# Patient Record
Sex: Female | Born: 1960 | Race: White | Hispanic: No | Marital: Single | Smoking: Never smoker
Health system: Southern US, Community
[De-identification: ages and names within clinical notes are randomized; demographics above are authoritative.]

## PROBLEM LIST (undated history)

## (undated) DIAGNOSIS — K219 Gastro-esophageal reflux disease without esophagitis: Secondary | ICD-10-CM

## (undated) DIAGNOSIS — F419 Anxiety disorder, unspecified: Secondary | ICD-10-CM

## (undated) DIAGNOSIS — K429 Umbilical hernia without obstruction or gangrene: Secondary | ICD-10-CM

## (undated) DIAGNOSIS — I10 Essential (primary) hypertension: Secondary | ICD-10-CM

## (undated) HISTORY — PX: CARDIAC CATHETERIZATION: SHX172

## (undated) HISTORY — PX: TUBAL LIGATION: SHX77

---

## 2010-01-12 ENCOUNTER — Emergency Department: Payer: Self-pay | Admitting: Emergency Medicine

## 2012-08-26 ENCOUNTER — Ambulatory Visit: Payer: Self-pay | Admitting: Family Medicine

## 2013-08-28 ENCOUNTER — Emergency Department: Payer: Self-pay | Admitting: Emergency Medicine

## 2013-08-28 LAB — BASIC METABOLIC PANEL
Anion Gap: 9 (ref 7–16)
BUN: 19 mg/dL — AB (ref 7–18)
Calcium, Total: 9.6 mg/dL (ref 8.5–10.1)
Chloride: 106 mmol/L (ref 98–107)
Co2: 27 mmol/L (ref 21–32)
Creatinine: 0.58 mg/dL — ABNORMAL LOW (ref 0.60–1.30)
EGFR (African American): >60
Glucose: 95 mg/dL (ref 65–99)
OSMOLALITY: 285 (ref 275–301)
Potassium: 3.5 mmol/L (ref 3.5–5.1)
SODIUM: 142 mmol/L (ref 136–145)

## 2013-08-28 LAB — CBC WITH DIFFERENTIAL/PLATELET
Basophil #: 0.1 10*3/uL (ref 0.0–0.1)
Basophil %: 0.6 %
EOS ABS: 0.3 10*3/uL (ref 0.0–0.7)
Eosinophil %: 2.7 %
HCT: 42 % (ref 35.0–47.0)
HGB: 13.7 g/dL (ref 12.0–16.0)
LYMPHS ABS: 2.1 10*3/uL (ref 1.0–3.6)
Lymphocyte %: 17.2 %
MCH: 27.7 pg (ref 26.0–34.0)
MCHC: 32.6 g/dL (ref 32.0–36.0)
MCV: 85 fL (ref 80–100)
Monocyte #: 0.7 x10 3/mm (ref 0.2–0.9)
Monocyte %: 6 %
Neutrophil #: 9 10*3/uL — ABNORMAL HIGH (ref 1.4–6.5)
Neutrophil %: 73.5 %
Platelet: 315 10*3/uL (ref 150–440)
RBC: 4.95 10*6/uL (ref 3.80–5.20)
RDW: 14.1 % (ref 11.5–14.5)
WBC: 12.2 10*3/uL — AB (ref 3.6–11.0)

## 2013-08-28 LAB — URINALYSIS, COMPLETE
BACTERIA: NONE SEEN
Bilirubin,UR: NEGATIVE
Glucose,UR: NEGATIVE mg/dL (ref 0–75)
Ketone: NEGATIVE
Nitrite: NEGATIVE
Ph: 5 (ref 4.5–8.0)
Protein: NEGATIVE
Specific Gravity: 1.005 (ref 1.003–1.030)
Squamous Epithelial: 2
WBC UR: 7 /HPF (ref 0–5)

## 2013-08-28 LAB — TROPONIN I: Troponin-I: <0.02 ng/mL

## 2013-11-19 ENCOUNTER — Ambulatory Visit: Payer: Self-pay | Admitting: Family Medicine

## 2013-11-19 LAB — RAPID STREP-A WITH REFLX: Micro Text Report: NEGATIVE

## 2013-11-22 LAB — BETA STREP CULTURE(ARMC)

## 2015-11-17 IMAGING — CT CT HEAD WITHOUT CONTRAST
1 series · 16 of 30 positions shown, 20 images · non-contrast
Comparison: None.

CLINICAL DATA: Headache and dizziness

EXAM:
CT HEAD WITHOUT CONTRAST
TECHNIQUE: Contiguous axial images were obtained from the base of the skull
through the vertex without intravenous contrast.

[Series 2: head wo · axial · 0.41mm/px · z∈[-21,+112]mm · 16 of 30 slices shown, 20 images]
[im 2/30  brain]
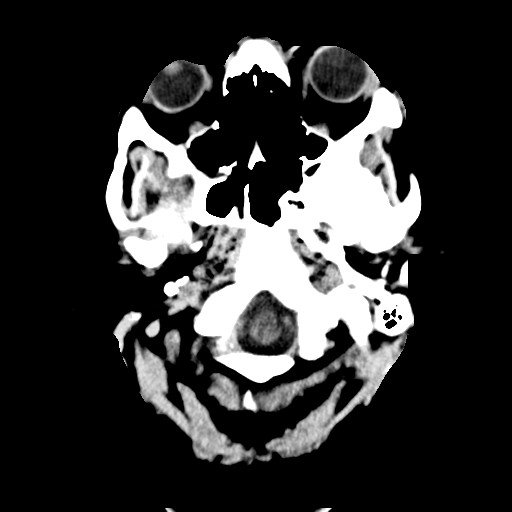
[im 2/30  bone]
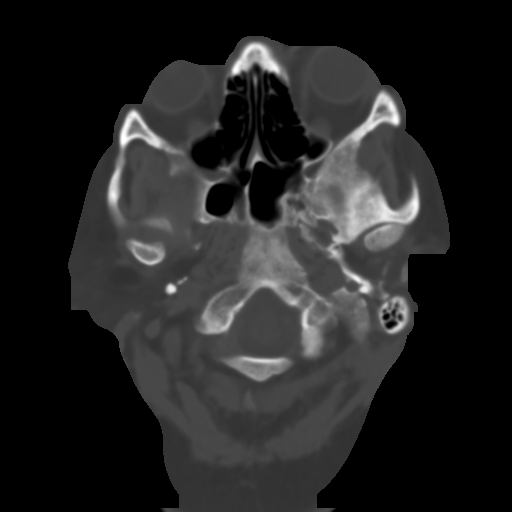
[im 4/30  brain]
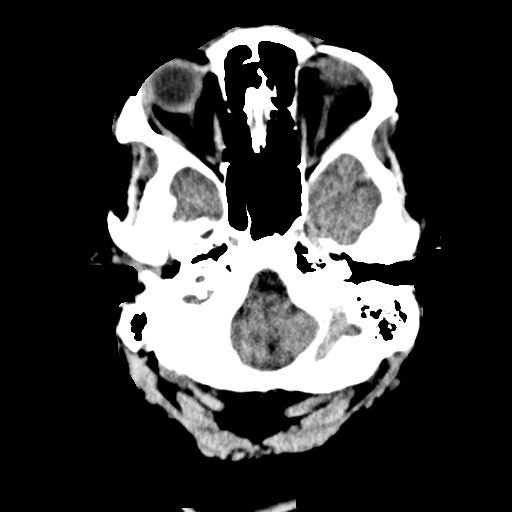
[im 6/30  brain]
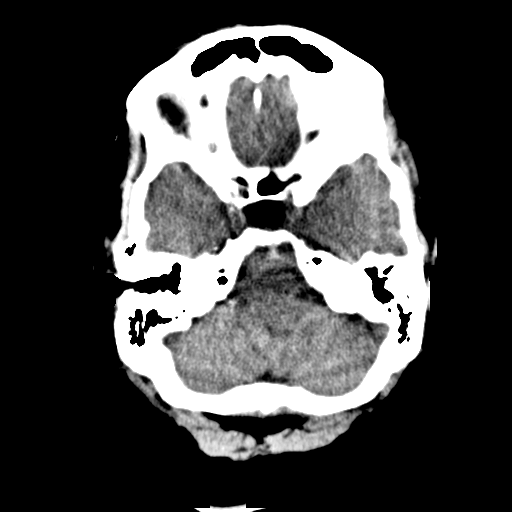
[im 8/30  brain]
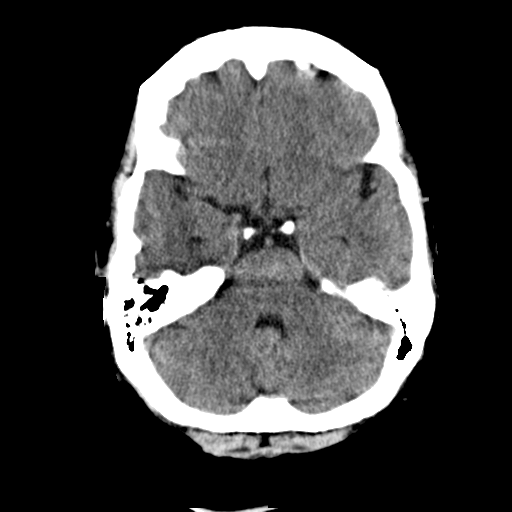
[im 9/30  brain]
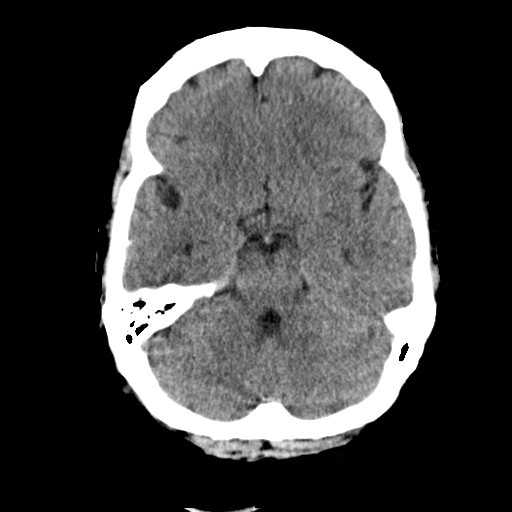
[im 9/30  bone]
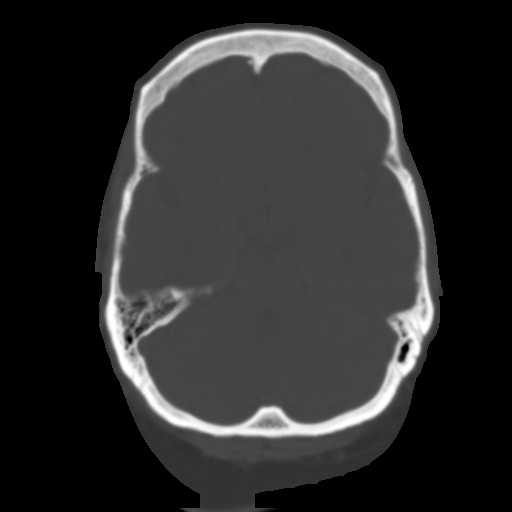
[im 11/30  brain]
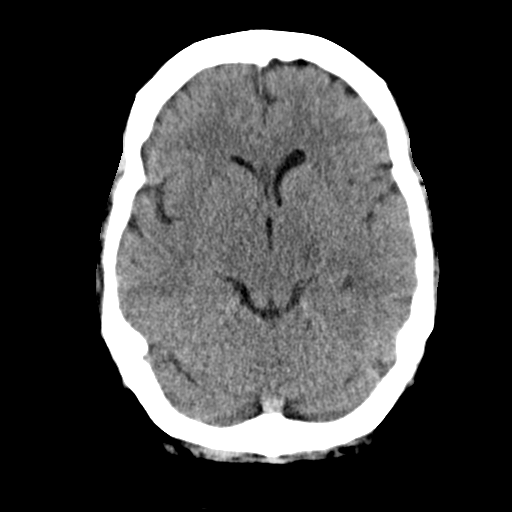
[im 13/30  brain]
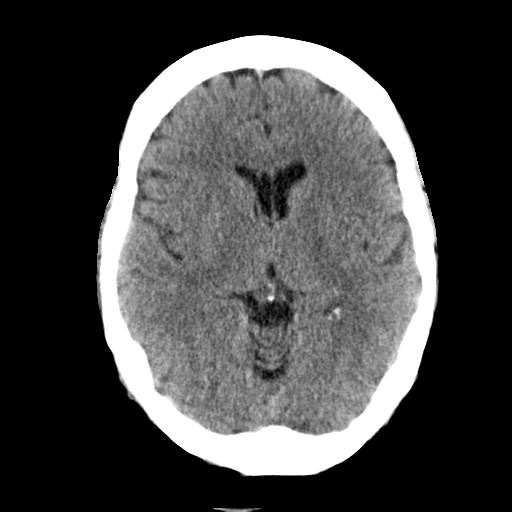
[im 15/30  brain]
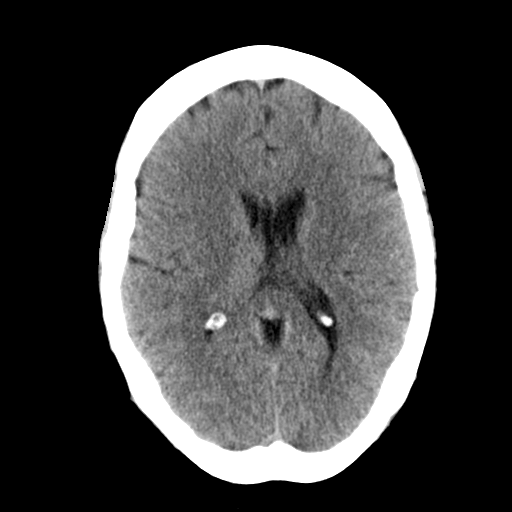
[im 16/30  brain]
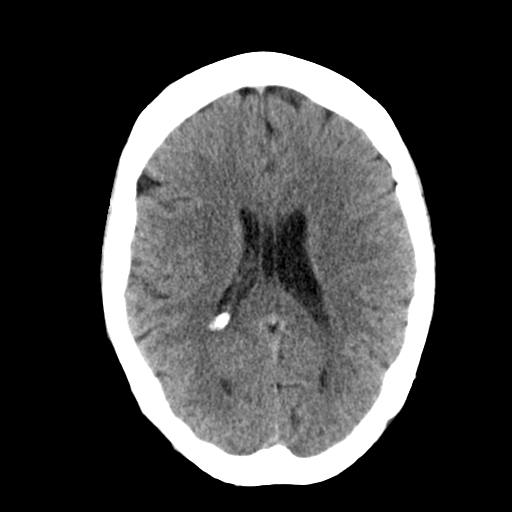
[im 16/30  bone]
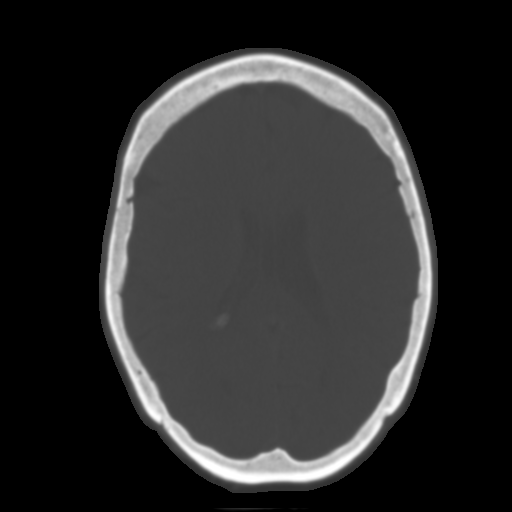
[im 18/30  brain]
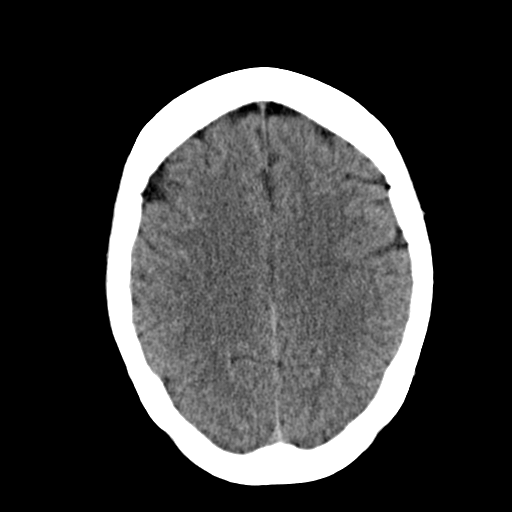
[im 20/30  brain]
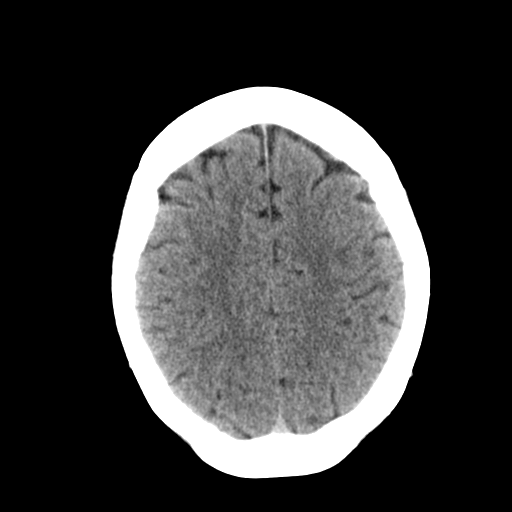
[im 22/30  brain]
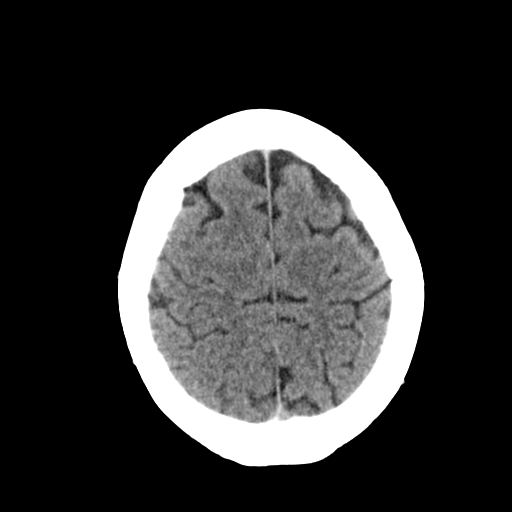
[im 23/30  brain]
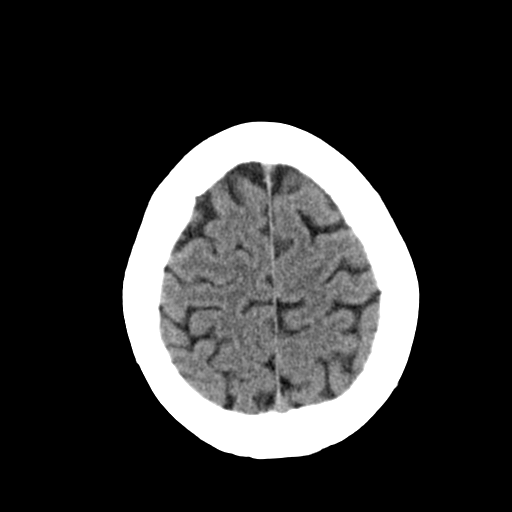
[im 23/30  bone]
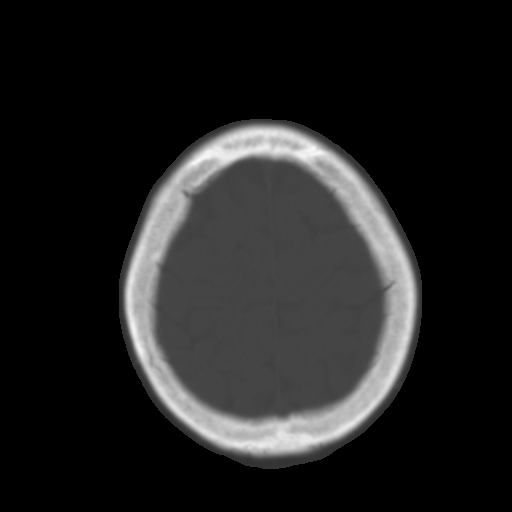
[im 25/30  brain]
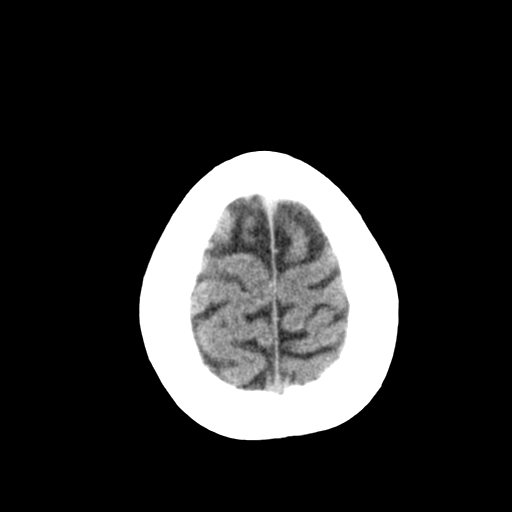
[im 27/30  brain]
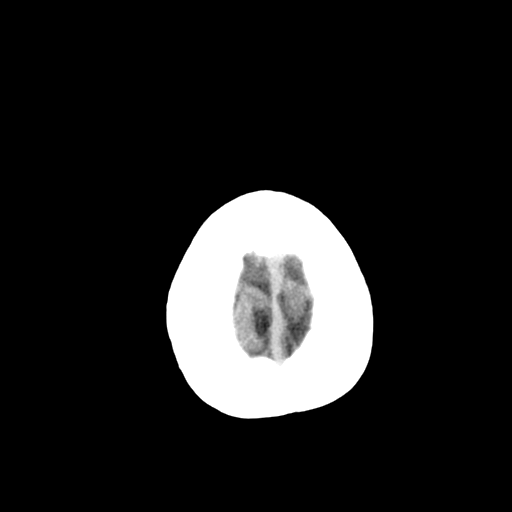
[im 29/30  brain]
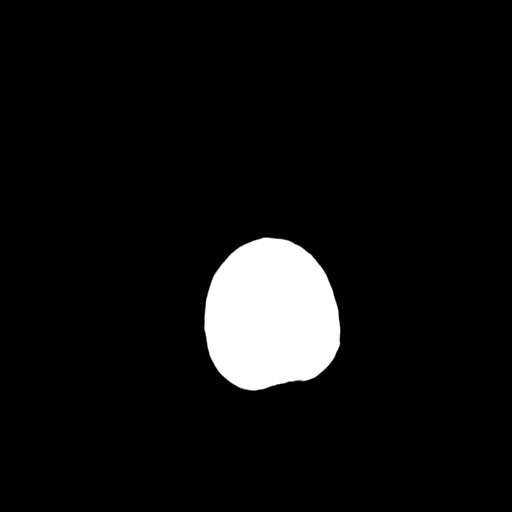

[16 of 30 positions shown; findings below may reference images not displayed]

FINDINGS: Skull and Sinuses:Negative for fracture or destructive process. The
mastoids, middle ears, and imaged paranasal sinuses are clear.

Orbits: No acute abnormality.

Brain: No evidence of acute abnormality, such as acute infarction,
hemorrhage, hydrocephalus, or mass lesion/mass effect.
IMPRESSION: Negative head CT.

## 2016-12-04 ENCOUNTER — Other Ambulatory Visit: Payer: Self-pay

## 2016-12-04 ENCOUNTER — Emergency Department
Admission: EM | Admit: 2016-12-04 | Discharge: 2016-12-04 | Disposition: A | Discharge status: Home / Self Care | Payer: BLUE CROSS/BLUE SHIELD | Attending: Emergency Medicine | Admitting: Emergency Medicine

## 2016-12-04 ENCOUNTER — Encounter: Payer: Self-pay | Admitting: Emergency Medicine

## 2016-12-04 ENCOUNTER — Emergency Department: Payer: BLUE CROSS/BLUE SHIELD

## 2016-12-04 DIAGNOSIS — R51 Headache: Secondary | ICD-10-CM | POA: Insufficient documentation

## 2016-12-04 DIAGNOSIS — R05 Cough: Secondary | ICD-10-CM | POA: Diagnosis not present

## 2016-12-04 DIAGNOSIS — I10 Essential (primary) hypertension: Secondary | ICD-10-CM | POA: Insufficient documentation

## 2016-12-04 DIAGNOSIS — J3489 Other specified disorders of nose and nasal sinuses: Secondary | ICD-10-CM | POA: Insufficient documentation

## 2016-12-04 DIAGNOSIS — Z2089 Contact with and (suspected) exposure to other communicable diseases: Secondary | ICD-10-CM | POA: Insufficient documentation

## 2016-12-04 DIAGNOSIS — R0789 Other chest pain: Secondary | ICD-10-CM | POA: Diagnosis present

## 2016-12-04 DIAGNOSIS — J40 Bronchitis, not specified as acute or chronic: Secondary | ICD-10-CM | POA: Diagnosis not present

## 2016-12-04 HISTORY — DX: Essential (primary) hypertension: I10

## 2016-12-04 LAB — BASIC METABOLIC PANEL
ANION GAP: 11 (ref 5–15)
BUN: 19 mg/dL (ref 6–20)
CALCIUM: 10.7 mg/dL — AB (ref 8.9–10.3)
CO2: 24 mmol/L (ref 22–32)
CREATININE: 0.51 mg/dL (ref 0.44–1.00)
Chloride: 101 mmol/L (ref 101–111)
GFR calc Af Amer: >60 mL/min (ref >60)
Glucose, Bld: 91 mg/dL (ref 65–99)
Potassium: 3.7 mmol/L (ref 3.5–5.1)
Sodium: 136 mmol/L (ref 135–145)

## 2016-12-04 LAB — CBC
HCT: 43.4 % (ref 35.0–47.0)
Hemoglobin: 14.5 g/dL (ref 12.0–16.0)
MCH: 28 pg (ref 26.0–34.0)
MCHC: 33.4 g/dL (ref 32.0–36.0)
MCV: 84 fL (ref 80.0–100.0)
PLATELETS: 341 10*3/uL (ref 150–440)
RBC: 5.17 MIL/uL (ref 3.80–5.20)
RDW: 14.5 % (ref 11.5–14.5)
WBC: 10.7 10*3/uL (ref 3.6–11.0)

## 2016-12-04 LAB — TROPONIN I

## 2016-12-04 MED ORDER — IPRATROPIUM-ALBUTEROL 0.5-2.5 (3) MG/3ML IN SOLN
3.0000 mL | Freq: Once | RESPIRATORY_TRACT | Status: AC
  Administered 2016-12-04: 3 mL via RESPIRATORY_TRACT
  Filled 2016-12-04: qty 3

## 2016-12-04 MED ORDER — ACETAMINOPHEN 325 MG PO TABS
650.0000 mg | ORAL_TABLET | Freq: Once | ORAL | Status: AC
  Administered 2016-12-04: 650 mg via ORAL
  Filled 2016-12-04: qty 2

## 2016-12-04 MED ORDER — ALBUTEROL SULFATE HFA 108 (90 BASE) MCG/ACT IN AERS: 2.0000 | INHALATION_SPRAY | Freq: Four times a day (QID) | RESPIRATORY_TRACT | 2 refills | Status: AC | PRN

## 2016-12-04 NOTE — ED Triage Notes (Signed)
Pt to ED c/o central and left chest pain x2 days radiating through to back.  Started as sharp pain and today is like pressure.  States nonproductive cough at home, had been diaphoretic, lightheaded, pain worse with deep breaths and movement.

## 2016-12-04 NOTE — ED Notes (Signed)
Pt states "the pain has been getting worse the last few days, but I just noticed today its getting a little harder to breathe"

## 2016-12-04 NOTE — ED Provider Notes (Signed)
[facility] Emergency Department Provider Note  ____________________________________________   I have reviewed the triage vital signs and the nursing notes.   HISTORY  Chief Complaint Chest Pain    HPI Maryama Cantu Bacchi is a 56 y.o. female with a history of hypertension, no history of ACS in fact had a negative heart cath a few years ago in [place] she states, presents today complaining of chest wall pain worse with cough.  She has had rhinorrhea and cough and slight headache for the last couple days.  Her family member was sick with same and she took care of them and now she feels sick herself.  The pain is in the chondral region, is worse when she touches or changes position, no radiation, is a sharp discomfort when she moves.  No history of DVT or PE in herself and her family no recent travel no leg swelling, pain is not pleuritic.  She has taken nothing for this pain.  She does not have a productive cough.  Past Medical History:  Diagnosis Date  . Hypertension     There are no active problems to display for this patient.   History reviewed. No pertinent surgical history.  Prior to Admission medications   Not on File    Allergies Patient has no known allergies.  History reviewed. No pertinent family history.  Social History Social History   Tobacco Use  . Smoking status: Never Smoker  . Smokeless tobacco: Never Used  Substance Use Topics  . Alcohol use: Yes    Comment: occ.  . Drug use: No    Review of Systems Constitutional: No fever/chills Eyes: No visual changes. ENT: No sore throat. No stiff neck no neck pain Cardiovascular: Positive chest wall pain Respiratory: Denies shortness of breath.  Positive cough Gastrointestinal:   no vomiting.  No diarrhea.  No constipation. Genitourinary: Negative for dysuria. Musculoskeletal: Negative lower extremity swelling Skin: Negative for rash. Neurological: Negative for severe headaches,  focal weakness or numbness.   ____________________________________________   PHYSICAL EXAM:  VITAL SIGNS: ED Triage Vitals  Enc Vitals Group     BP 12/04/16 1614 (!) 154/88     Pulse Rate 12/04/16 1614 66     Resp 12/04/16 1614 16     Temp 12/04/16 1614 98.3 F (36.8 C)     Temp Source 12/04/16 1614 Oral     SpO2 12/04/16 1614 100 %     Weight 12/04/16 1615 237 lb (107.5 kg)     Height 12/04/16 1615 5\' 2"  (1.575 m)     Head Circumference --      Peak Flow --      Pain Score 12/04/16 1614 0     Pain Loc --      Pain Edu? --      Excl. in [place]? --     Constitutional: Alert and oriented. Well appearing and in no acute distress. Eyes: Conjunctivae are normal Head: Atraumatic HEENT: No congestion/rhinnorhea. Mucous membranes are moist.  Oropharynx non-erythematous Neck:   Nontender with no meningismus, no masses, no stridor Cardiovascular: Normal rate, regular rhythm. Grossly normal heart sounds.  Good peripheral circulation. Chest: Tender to palpation in the chondral regions bilaterally, when I touch these areas patient say "ouch that the pain right there" and pulls back.  There is no crepitus there is no flail chest there is no lesions noted.  Female nurse chaperone present. Respiratory: Normal respiratory effort.  No retractions. Lungs slight wheezing when I asked her  to take a deep breath she coughs which causes her chest to hurt.   Abdominal: Soft and nontender. No distention. No guarding no rebound Back:  There is no focal tenderness or step off.  there is no midline tenderness there are no lesions noted. there is no CVA tenderness Musculoskeletal: No lower extremity tenderness, no upper extremity tenderness. No joint effusions, no DVT signs strong distal pulses no edema Neurologic:  Normal speech and language. No gross focal neurologic deficits are appreciated.  Skin:  Skin is warm, dry and intact. No rash noted. Psychiatric: Mood and affect are normal. Speech and behavior  are normal.  ____________________________________________   LABS (all labs ordered are listed, but only abnormal results are displayed)  Labs Reviewed  BASIC METABOLIC PANEL - Abnormal; Notable for the following components:      Result Value   Calcium 10.7 (*)    All other components within normal limits  CBC  TROPONIN I    Pertinent labs  results that were available during my care of the patient were reviewed by me and considered in my medical decision making (see chart for details). ____________________________________________  EKG  I personally interpreted any EKGs ordered by me or triage Sinus rhythm rate 69 bpm no acute ST elevation or depression nonspecific ST changes, borderline LAD change from 3 years ago ____________________________________________  RADIOLOGY  Pertinent labs & imaging results that were available during my care of the patient were reviewed by me and considered in my medical decision making (see chart for details). If possible, patient and/or family made aware of any abnormal findings. ____________________________________________    PROCEDURES  Procedure(s) performed: None  Procedures  Critical Care performed: None  ____________________________________________   INITIAL IMPRESSION / ASSESSMENT AND PLAN / ED COURSE  Pertinent labs & imaging results that were available during my care of the patient were reviewed by me and considered in my medical decision making (see chart for details).  Patient with constant reproducible chest wall pain in the context of a nonproductive cough for 3 days despite this, cardiac enzymes are negative and EKG shows no change from prior.  Very low suspicion for ACS.  At this time, there does not appear to be clinical evidence to support the diagnosis of pulmonary embolus, dissection, myocarditis, endocarditis, pericarditis, pericardial tamponade, acute coronary syndrome, pneumothorax, pneumonia, or any other acute  intrathoracic pathology that will require admission or acute intervention. Nor is there evidence of any significant intra-abdominal pathology causing this discomfort.  Patient is feeling much better after albuterol treatment, will send her home with albuterol for what is likely bronchitis.  Very reproducible chest wall pain with negative cardiac enzymes despite 3 days of constant pain with no pleuritic nature and no significant risk factors for DVT or PE.    ____________________________________________   FINAL CLINICAL IMPRESSION(S) / ED DIAGNOSES  Final diagnoses:  None      This chart was dictated using voice recognition software.  Despite best efforts to proofread,  errors can occur which can change meaning.      Schuyler Amor, MD 12/04/16 [phone]

## 2016-12-04 NOTE — ED Notes (Signed)
Triage started being charted in error under [place], EDT.  Triage was completed by Annie Main, RN.

## 2017-02-10 ENCOUNTER — Other Ambulatory Visit: Payer: Self-pay | Admitting: Family Medicine

## 2017-02-10 DIAGNOSIS — Z1231 Encounter for screening mammogram for malignant neoplasm of breast: Secondary | ICD-10-CM

## 2017-03-06 ENCOUNTER — Ambulatory Visit
Admission: RE | Admit: 2017-03-06 | Discharge: 2017-03-06 | Disposition: A | Discharge status: Home / Self Care | Payer: BLUE CROSS/BLUE SHIELD | Source: Ambulatory Visit | Attending: Family Medicine | Admitting: Family Medicine

## 2017-03-06 DIAGNOSIS — Z1231 Encounter for screening mammogram for malignant neoplasm of breast: Secondary | ICD-10-CM | POA: Diagnosis not present

## 2017-03-11 ENCOUNTER — Encounter: Admission: RE | Payer: Self-pay | Source: Ambulatory Visit

## 2017-03-11 ENCOUNTER — Ambulatory Visit
Admission: RE | Admit: 2017-03-11 | Payer: BLUE CROSS/BLUE SHIELD | Source: Ambulatory Visit | Admitting: Unknown Physician Specialty

## 2017-03-11 SURGERY — COLONOSCOPY WITH PROPOFOL
Anesthesia: General

## 2017-04-09 ENCOUNTER — Emergency Department
Admission: EM | Admit: 2017-04-09 | Discharge: 2017-04-09 | Disposition: A | Discharge status: Home / Self Care | Payer: BLUE CROSS/BLUE SHIELD | Attending: Student in an Organized Health Care Education/Training Program | Admitting: Student in an Organized Health Care Education/Training Program

## 2017-04-09 ENCOUNTER — Encounter: Payer: Self-pay | Admitting: *Deleted

## 2017-04-09 DIAGNOSIS — R040 Epistaxis: Secondary | ICD-10-CM | POA: Diagnosis present

## 2017-04-09 DIAGNOSIS — I10 Essential (primary) hypertension: Secondary | ICD-10-CM | POA: Insufficient documentation

## 2017-04-09 MED ORDER — OXYMETAZOLINE HCL 0.05 % NA SOLN
1.0000 | Freq: Once | NASAL | Status: AC
  Administered 2017-04-09: 1 via NASAL

## 2017-04-09 MED ORDER — OXYMETAZOLINE HCL 0.05 % NA SOLN
NASAL | Status: AC
  Administered 2017-04-09: 22:00:00
  Filled 2017-04-09: qty 15

## 2017-04-09 NOTE — ED Notes (Signed)
Pt blowing her nose in triage, educated about not doing so at this time

## 2017-04-09 NOTE — ED Notes (Signed)
Pt states she has had a cold for about 3 days. She started to develop a HA today, she took some aspirin and she noticed she started having a gushing nose bleed around 5:30 and she got it to stop and it started back a few hours later out of both nostrils this time. Nose continued to stop and and start gushing blood since then. Nose is not actively bleeding now. Pt is alert and oriented x 4.

## 2017-04-09 NOTE — ED Triage Notes (Signed)
Pt reports onset of nosebleeds intermittent since this afternoon. Pt says that she has had cough, cold and congestion x 3 days. No current blood thinners. Bleeding has subsided at time of triage.

## 2017-04-09 NOTE — Discharge Instructions (Signed)
Use the nasal spray and nose clip to manage active bleeding, as demonstrated. Avoid blowing your nose for the next few days. Use dampened cotton swabs to cleanse or 'scratch' the nose. See your provider or return as needed.

## 2017-04-11 NOTE — ED Provider Notes (Signed)
[facility] Emergency Department Provider Note ____________________________________________  Time seen: 2255  I have reviewed the triage vital signs and the nursing notes.  HISTORY  Chief Complaint  Epistaxis  HPI Amy Barker is a 57 y.o. female presents to the ED for evaluation of intermittent nosebleeds since early this afternoon.  Patient admits that she has had several days of cough, congestion, nasal drainage, and sneezing.  She describes bleeding that started out of her right nostril earlier today.  She presented to the ED after she felt had a difficult time getting the nosebleed to stop.  She denies any nausea, vomiting, or dizziness.  She takes no daily blood thinners and has not been using any nasal sprays.  Denies any current pain or active bleeding at this time.  Patient was given a nasal clip in triage.  Past Medical History:  Diagnosis Date  . Hypertension     There are no active problems to display for this patient.   History reviewed. No pertinent surgical history.  Prior to Admission medications   Medication Sig Start Date End Date Taking? Authorizing Provider  albuterol (PROVENTIL HFA;VENTOLIN HFA) 108 (90 Base) MCG/ACT inhaler Inhale 2 puffs every 6 (six) hours as needed into the lungs for wheezing or shortness of breath. 12/04/16   Schuyler Amor, MD   Allergies Patient has no known allergies.  Family History  Problem Relation Age of Onset  . Breast cancer Sister   . Breast cancer Cousin     Social History Social History   Tobacco Use  . Smoking status: Never Smoker  . Smokeless tobacco: Never Used  Substance Use Topics  . Alcohol use: Yes    Comment: occ.  . Drug use: No    Review of Systems  Constitutional: Negative for fever. Eyes: Negative for visual changes. ENT: Negative for sore throat.  Nosebleeds as above. Cardiovascular: Negative for chest pain. Respiratory: Negative for shortness of  breath. Gastrointestinal: Negative for abdominal pain, vomiting and diarrhea. Neurological: Negative for headaches, focal weakness or numbness. ____________________________________________  PHYSICAL EXAM:  VITAL SIGNS: ED Triage Vitals  Enc Vitals Group     BP 04/09/17 2126 (!) 155/76     Pulse Rate 04/09/17 2126 93     Resp 04/09/17 2126 16     Temp 04/09/17 2126 98.5 F (36.9 C)     Temp Source 04/09/17 2126 Oral     SpO2 04/09/17 2126 96 %     Weight 04/09/17 2126 240 lb (108.9 kg)     Height 04/09/17 2126 5\' 2"  (1.575 m)     Head Circumference --      Peak Flow --      Pain Score 04/09/17 2125 7     Pain Loc --      Pain Edu? --      Excl. in [place]? --     Constitutional: Alert and oriented. Well appearing and in no distress. Head: Normocephalic and atraumatic. Eyes: Conjunctivae are normal. PERRL. Normal extraocular movements Ears: Canals clear. TMs intact bilaterally. Nose: No congestion/rhinorrhea. No current epistaxis.  Right nostril with some fresh blood noted without any active bleeding. Mouth/Throat: Mucous membranes are moist. Neck: Supple. No thyromegaly. Cardiovascular: Normal rate, regular rhythm. Normal distal pulses. Respiratory: Normal respiratory effort. No wheezes/rales/rhonchi. Musculoskeletal: Nontender with normal range of motion in all extremities.  Neurologic:  Normal gait without ataxia. Normal speech and language. No gross focal neurologic deficits are appreciated. Skin:  Skin is warm, dry and  intact. No rash noted. Psychiatric: Mood and affect are normal. Patient exhibits appropriate insight and judgment. ____________________________________________  PROCEDURES  Procedures Oxymetazoline 0.05% ii gtts per nostril ____________________________________________  INITIAL IMPRESSION / ASSESSMENT AND PLAN / ED COURSE  Patient with ED evaluation of spontaneous nosebleed on the right.  Patient denies any preceding trauma.  She does admit to several  days of sinus symptoms including nasal drainage and sinus congestion.  She presents to the ED for evaluation management of a nosebleed that has now resolved at the time of her evaluation.  Patient is reassured by her exam overall.  Afrin is placed in the nose to further clear any blood and shows no active bleeding.  Patient is discharged with instructions on management of acute nosebleeds.  She will follow with her primary provider return to the ED as needed. He is discharged with stable vital signs. ____________________________________________  FINAL CLINICAL IMPRESSION(S) / ED DIAGNOSES  Final diagnoses:  Epistaxis      Carmie End, Dannielle Karvonen, PA-C 04/11/17 1915    Merlyn Lot, MD 04/12/17 1930

## 2017-05-01 ENCOUNTER — Encounter: Admission: RE | Payer: Self-pay | Source: Ambulatory Visit

## 2017-05-01 ENCOUNTER — Ambulatory Visit
Admission: RE | Admit: 2017-05-01 | Payer: BLUE CROSS/BLUE SHIELD | Source: Ambulatory Visit | Admitting: Unknown Physician Specialty

## 2017-05-01 SURGERY — COLONOSCOPY WITH PROPOFOL
Anesthesia: General

## 2017-05-07 ENCOUNTER — Encounter: Payer: Self-pay | Admitting: *Deleted

## 2017-05-08 ENCOUNTER — Ambulatory Visit: Payer: BLUE CROSS/BLUE SHIELD | Admitting: Anesthesiology

## 2017-05-08 ENCOUNTER — Encounter: Payer: Self-pay | Admitting: *Deleted

## 2017-05-08 ENCOUNTER — Ambulatory Visit
Admission: RE | Admit: 2017-05-08 | Discharge: 2017-05-08 | Disposition: A | Discharge status: Home / Self Care | Payer: BLUE CROSS/BLUE SHIELD | Source: Ambulatory Visit | Attending: Unknown Physician Specialty | Admitting: Unknown Physician Specialty

## 2017-05-08 ENCOUNTER — Encounter
Admission: RE | Disposition: A | Discharge status: Home / Self Care | Payer: Self-pay | Source: Ambulatory Visit | Attending: Unknown Physician Specialty

## 2017-05-08 DIAGNOSIS — D123 Benign neoplasm of transverse colon: Secondary | ICD-10-CM | POA: Insufficient documentation

## 2017-05-08 DIAGNOSIS — Z800 Family history of malignant neoplasm of digestive organs: Secondary | ICD-10-CM | POA: Diagnosis present

## 2017-05-08 DIAGNOSIS — Z6841 Body Mass Index (BMI) 40.0 and over, adult: Secondary | ICD-10-CM | POA: Diagnosis not present

## 2017-05-08 DIAGNOSIS — I10 Essential (primary) hypertension: Secondary | ICD-10-CM | POA: Insufficient documentation

## 2017-05-08 DIAGNOSIS — Z7982 Long term (current) use of aspirin: Secondary | ICD-10-CM | POA: Diagnosis not present

## 2017-05-08 DIAGNOSIS — K640 First degree hemorrhoids: Secondary | ICD-10-CM | POA: Diagnosis not present

## 2017-05-08 DIAGNOSIS — Z1211 Encounter for screening for malignant neoplasm of colon: Secondary | ICD-10-CM | POA: Diagnosis not present

## 2017-05-08 DIAGNOSIS — Z79899 Other long term (current) drug therapy: Secondary | ICD-10-CM | POA: Insufficient documentation

## 2017-05-08 HISTORY — DX: Gastro-esophageal reflux disease without esophagitis: K21.9

## 2017-05-08 HISTORY — PX: COLONOSCOPY WITH PROPOFOL: SHX5780

## 2017-05-08 HISTORY — DX: Morbid (severe) obesity due to excess calories: E66.01

## 2017-05-08 HISTORY — DX: Umbilical hernia without obstruction or gangrene: K42.9

## 2017-05-08 HISTORY — DX: Anxiety disorder, unspecified: F41.9

## 2017-05-08 SURGERY — COLONOSCOPY WITH PROPOFOL
Anesthesia: General

## 2017-05-08 MED ORDER — LIDOCAINE HCL (PF) 2 % IJ SOLN
INTRAMUSCULAR | Status: DC | PRN
  Administered 2017-05-08: 100 mg

## 2017-05-08 MED ORDER — PROPOFOL 10 MG/ML IV BOLUS
INTRAVENOUS | Status: DC | PRN
  Administered 2017-05-08 (×2): 20 mg via INTRAVENOUS
  Administered 2017-05-08: 10 mg via INTRAVENOUS

## 2017-05-08 MED ORDER — SODIUM CHLORIDE 0.9 % IV SOLN: INTRAVENOUS | Status: DC

## 2017-05-08 MED ORDER — PROPOFOL 500 MG/50ML IV EMUL
INTRAVENOUS | Status: DC | PRN
  Administered 2017-05-08: 70 ug/kg/min via INTRAVENOUS

## 2017-05-08 MED ORDER — MIDAZOLAM HCL 2 MG/2ML IJ SOLN
INTRAMUSCULAR | Status: AC
  Filled 2017-05-08: qty 2

## 2017-05-08 MED ORDER — LIDOCAINE HCL (PF) 2 % IJ SOLN
INTRAMUSCULAR | Status: AC
  Filled 2017-05-08: qty 10

## 2017-05-08 MED ORDER — FENTANYL CITRATE (PF) 100 MCG/2ML IJ SOLN
INTRAMUSCULAR | Status: AC
  Filled 2017-05-08: qty 2

## 2017-05-08 MED ORDER — SODIUM CHLORIDE 0.9 % IV SOLN
INTRAVENOUS | Status: DC
  Administered 2017-05-08: 1000 mL via INTRAVENOUS

## 2017-05-08 MED ORDER — EPHEDRINE SULFATE 50 MG/ML IJ SOLN
INTRAMUSCULAR | Status: DC | PRN
  Administered 2017-05-08: 15 mg via INTRAVENOUS
  Administered 2017-05-08: 10 mg via INTRAVENOUS

## 2017-05-08 MED ORDER — FENTANYL CITRATE (PF) 100 MCG/2ML IJ SOLN
INTRAMUSCULAR | Status: DC | PRN
  Administered 2017-05-08 (×4): 25 ug via INTRAVENOUS

## 2017-05-08 MED ORDER — MIDAZOLAM HCL 5 MG/5ML IJ SOLN
INTRAMUSCULAR | Status: DC | PRN
  Administered 2017-05-08: 2 mg via INTRAVENOUS

## 2017-05-08 NOTE — Op Note (Signed)
[facility] Gastroenterology Patient Name: Amy Barker Procedure Date: 05/08/2017 9:41 AM MRN: [id number] Account #: [id number] Date of Birth: 1961-01-25 Admit Type: Outpatient Age: 57 Room: [facility] ENDO ROOM 1 Gender: Female Note Status: Finalized Procedure:            Colonoscopy Indications:          Screening in patient at increased risk: Family history                        of 1st-degree relative with colorectal cancer Providers:            Manya Silvas, MD Referring MD:         Wynona Canes. Kym Groom, MD (Referring MD) Medicines:            Propofol per Anesthesia Complications:        No immediate complications. Procedure:            Pre-Anesthesia Assessment:                       - After reviewing the risks and benefits, the patient                        was deemed in satisfactory condition to undergo the                        procedure.                       After obtaining informed consent, the colonoscope was                        passed under direct vision. Throughout the procedure,                        the patient's blood pressure, pulse, and oxygen                        saturations were monitored continuously. The                        Colonoscope was introduced through the anus and                        advanced to the the cecum, identified by appendiceal                        orifice and ileocecal valve. The colonoscopy was                        performed without difficulty. The patient tolerated the                        procedure well. The quality of the bowel preparation                        was good. Findings:      A diminutive polyp was found in the splenic flexure. The polyp was       sessile. The polyp was removed with a jumbo cold forceps. Resection and       retrieval were complete.  Internal hemorrhoids were found during endoscopy. The hemorrhoids were       small and Grade I (internal hemorrhoids that do not  prolapse).      The exam was otherwise without abnormality. Impression:           - One diminutive polyp at the splenic flexure, removed                        with a jumbo cold forceps. Resected and retrieved.                       - Internal hemorrhoids.                       - The examination was otherwise normal. Recommendation:       - Await pathology results. Probably repeat in 5 years. Manya Silvas, MD 05/08/2017 10:07:02 AM This report has been signed electronically. Number of Addenda: 0 Note Initiated On: 05/08/2017 9:41 AM Scope Withdrawal Time: 0 hours 9 minutes 43 seconds  Total Procedure Duration: 0 hours 16 minutes 49 seconds       [facility]

## 2017-05-08 NOTE — Anesthesia Postprocedure Evaluation (Signed)
Anesthesia Post Note  Patient: Amy Barker  Procedure(s) Performed: COLONOSCOPY WITH PROPOFOL (N/A )  Patient location during evaluation: Endoscopy Anesthesia Type: General Level of consciousness: awake and alert Pain management: pain level controlled Vital Signs Assessment: post-procedure vital signs reviewed and stable Respiratory status: spontaneous breathing, nonlabored ventilation, respiratory function stable and patient connected to nasal cannula oxygen Cardiovascular status: blood pressure returned to baseline and stable Postop Assessment: no apparent nausea or vomiting Anesthetic complications: no     Last Vitals:  Vitals:   05/08/17 1017 05/08/17 1027  BP: (!) 98/59 (!) 93/46  Pulse: 79 70  Resp: 17 12  Temp:    SpO2: 98% 98%    Last Pain:  Vitals:   05/08/17 1027  TempSrc:   PainSc: 0-No pain                 Precious Haws Myrel Rappleye

## 2017-05-08 NOTE — Transfer of Care (Signed)
Immediate Anesthesia Transfer of Care Note  Patient: Amy Barker  Procedure(s) Performed: COLONOSCOPY WITH PROPOFOL (N/A )  Patient Location: PACU  Anesthesia Type:General  Level of Consciousness: sedated  Airway & Oxygen Therapy: Patient Spontanous Breathing and Patient connected to nasal cannula oxygen  Post-op Assessment: Report given to RN and Post -op Vital signs reviewed and stable  Post vital signs: Reviewed and stable  Last Vitals:  Vitals Value Taken Time  BP    Temp    Pulse    Resp    SpO2      Last Pain:  Vitals:   05/08/17 0923  TempSrc: Tympanic  PainSc: 0-No pain         Complications: No apparent anesthesia complications

## 2017-05-08 NOTE — Anesthesia Post-op Follow-up Note (Signed)
Anesthesia QCDR form completed.        

## 2017-05-08 NOTE — Anesthesia Preprocedure Evaluation (Signed)
Anesthesia Evaluation  Patient identified by MRN, date of birth, ID band Patient awake    Reviewed: Allergy & Precautions, H&P , NPO status , Patient's Chart, lab work & pertinent test results  History of Anesthesia Complications Negative for: history of anesthetic complications  Airway Mallampati: III  TM Distance: <3 FB Neck ROM: full    Dental  (+) Chipped   Pulmonary neg pulmonary ROS,           Cardiovascular Exercise Tolerance: Good hypertension,      Neuro/Psych PSYCHIATRIC DISORDERS Anxiety negative neurological ROS     GI/Hepatic Neg liver ROS, GERD  Medicated and Controlled,  Endo/Other  negative endocrine ROS  Renal/GU negative Renal ROS  negative genitourinary   Musculoskeletal   Abdominal   Peds  Hematology negative hematology ROS (+)   Anesthesia Other Findings Signs and symptoms suggestive of sleep apnea   Past Medical History: No date: Anxiety No date: GERD (gastroesophageal reflux disease) No date: Hypertension No date: Morbid obesity ([place]) No date: Umbilical hernia  Past Surgical History: No date: TUBAL LIGATION  BMI    Body Mass Index:  43.90 kg/m      Reproductive/Obstetrics negative OB ROS                             Anesthesia Physical Anesthesia Plan  ASA: III  Anesthesia Plan: General   Post-op Pain Management:    Induction: Intravenous  PONV Risk Score and Plan: Propofol infusion and TIVA  Airway Management Planned: Natural Airway and Nasal Cannula  Additional Equipment:   Intra-op Plan:   Post-operative Plan:   Informed Consent: I have reviewed the patients History and Physical, chart, labs and discussed the procedure including the risks, benefits and alternatives for the proposed anesthesia with the patient or authorized representative who has indicated his/her understanding and acceptance.   Dental Advisory Given  Plan Discussed  with: Anesthesiologist, CRNA and Surgeon  Anesthesia Plan Comments: (Patient consented for risks of anesthesia including but not limited to:  - adverse reactions to medications - risk of intubation if required - damage to teeth, lips or other oral mucosa - sore throat or hoarseness - Damage to heart, brain, lungs or loss of life  Patient voiced understanding.)        Anesthesia Quick Evaluation

## 2017-05-08 NOTE — H&P (Signed)
Primary Care Physician:  Valera Castle, MD Primary Gastroenterologist:  Dr. Vira Agar  Pre-Procedure History & Physical: HPI:  Amy Barker is a 57 y.o. female is here for an colonoscopy.  Done due to sister with colon cancer and another sister with colon polyps.   Past Medical History:  Diagnosis Date  . Anxiety   . GERD (gastroesophageal reflux disease)   . Hypertension   . Morbid obesity ([place])   . Umbilical hernia     Past Surgical History:  Procedure Laterality Date  . TUBAL LIGATION      Prior to Admission medications   Medication Sig Start Date End Date Taking? Authorizing Provider  aspirin EC 81 MG tablet Take 81 mg by mouth daily.   Yes [provider]  losartan (COZAAR) 50 MG tablet Take 50 mg by mouth daily.   Yes [provider]  albuterol (PROVENTIL HFA;VENTOLIN HFA) 108 (90 Base) MCG/ACT inhaler Inhale 2 puffs every 6 (six) hours as needed into the lungs for wheezing or shortness of breath. 12/04/16   Schuyler Amor, MD    Allergies as of 05/07/2017  . (No Known Allergies)    Family History  Problem Relation Age of Onset  . Breast cancer Sister   . Breast cancer Cousin     Social History   Socioeconomic History  . Marital status: Single    Spouse name: Not on file  . Number of children: Not on file  . Years of education: Not on file  . Highest education level: Not on file  Occupational History  . Not on file  Social Needs  . Financial resource strain: Not on file  . Food insecurity:    Worry: Not on file    Inability: Not on file  . Transportation needs:    Medical: Not on file    Non-medical: Not on file  Tobacco Use  . Smoking status: Never Smoker  . Smokeless tobacco: Never Used  Substance and Sexual Activity  . Alcohol use: Yes    Comment: occ.  . Drug use: No  . Sexual activity: Not on file  Lifestyle  . Physical activity:    Days per week: Not on file    Minutes per session: Not on file  .  Stress: Not on file  Relationships  . Social connections:    Talks on phone: Not on file    Gets together: Not on file    Attends religious service: Not on file    Active member of club or organization: Not on file    Attends meetings of clubs or organizations: Not on file    Relationship status: Not on file  . Intimate partner violence:    Fear of current or ex partner: Not on file    Emotionally abused: Not on file    Physically abused: Not on file    Forced sexual activity: Not on file  Other Topics Concern  . Not on file  Social History Narrative  . Not on file    Review of Systems: See HPI, otherwise negative ROS  Physical Exam: BP (!) 131/95   Pulse 86   Temp (!) 97.1 F (36.2 C) (Tympanic)   Resp 16   Ht 5\' 2"  (1.575 m)   Wt 108.9 kg (240 lb)   SpO2 98%   BMI 43.90 kg/m  General:   Alert,  pleasant and cooperative in NAD Head:  Normocephalic and atraumatic. Neck:  Supple; no masses or thyromegaly.  Lungs:  Clear throughout to auscultation.    Heart:  Regular rate and rhythm. Abdomen:  Soft, nontender and nondistended. Normal bowel sounds, without guarding, and without rebound.   Neurologic:  Alert and  oriented x4;  grossly normal neurologically.  Impression/Plan: Amy Barker is here for an colonoscopy to be performed for colon cancer screening with sister with colon cancer  Risks, benefits, limitations, and alternatives regarding  colonoscopy have been reviewed with the patient.  Questions have been answered.  All parties agreeable.   Gaylyn Cheers, MD  05/08/2017, 9:39 AM

## 2017-05-11 LAB — SURGICAL PATHOLOGY

## 2018-03-09 ENCOUNTER — Other Ambulatory Visit: Payer: Self-pay | Admitting: Family Medicine

## 2018-03-09 DIAGNOSIS — Z1231 Encounter for screening mammogram for malignant neoplasm of breast: Secondary | ICD-10-CM

## 2019-02-23 IMAGING — CR DG CHEST 2V
2 series · 2 of 2 positions shown · non-contrast
Comparison: 01/12/2010

CLINICAL DATA: Left-sided chest pressure with radiation to left
shoulder 2 days. Headache.

EXAM:
CHEST  2 VIEW

[chest pa]
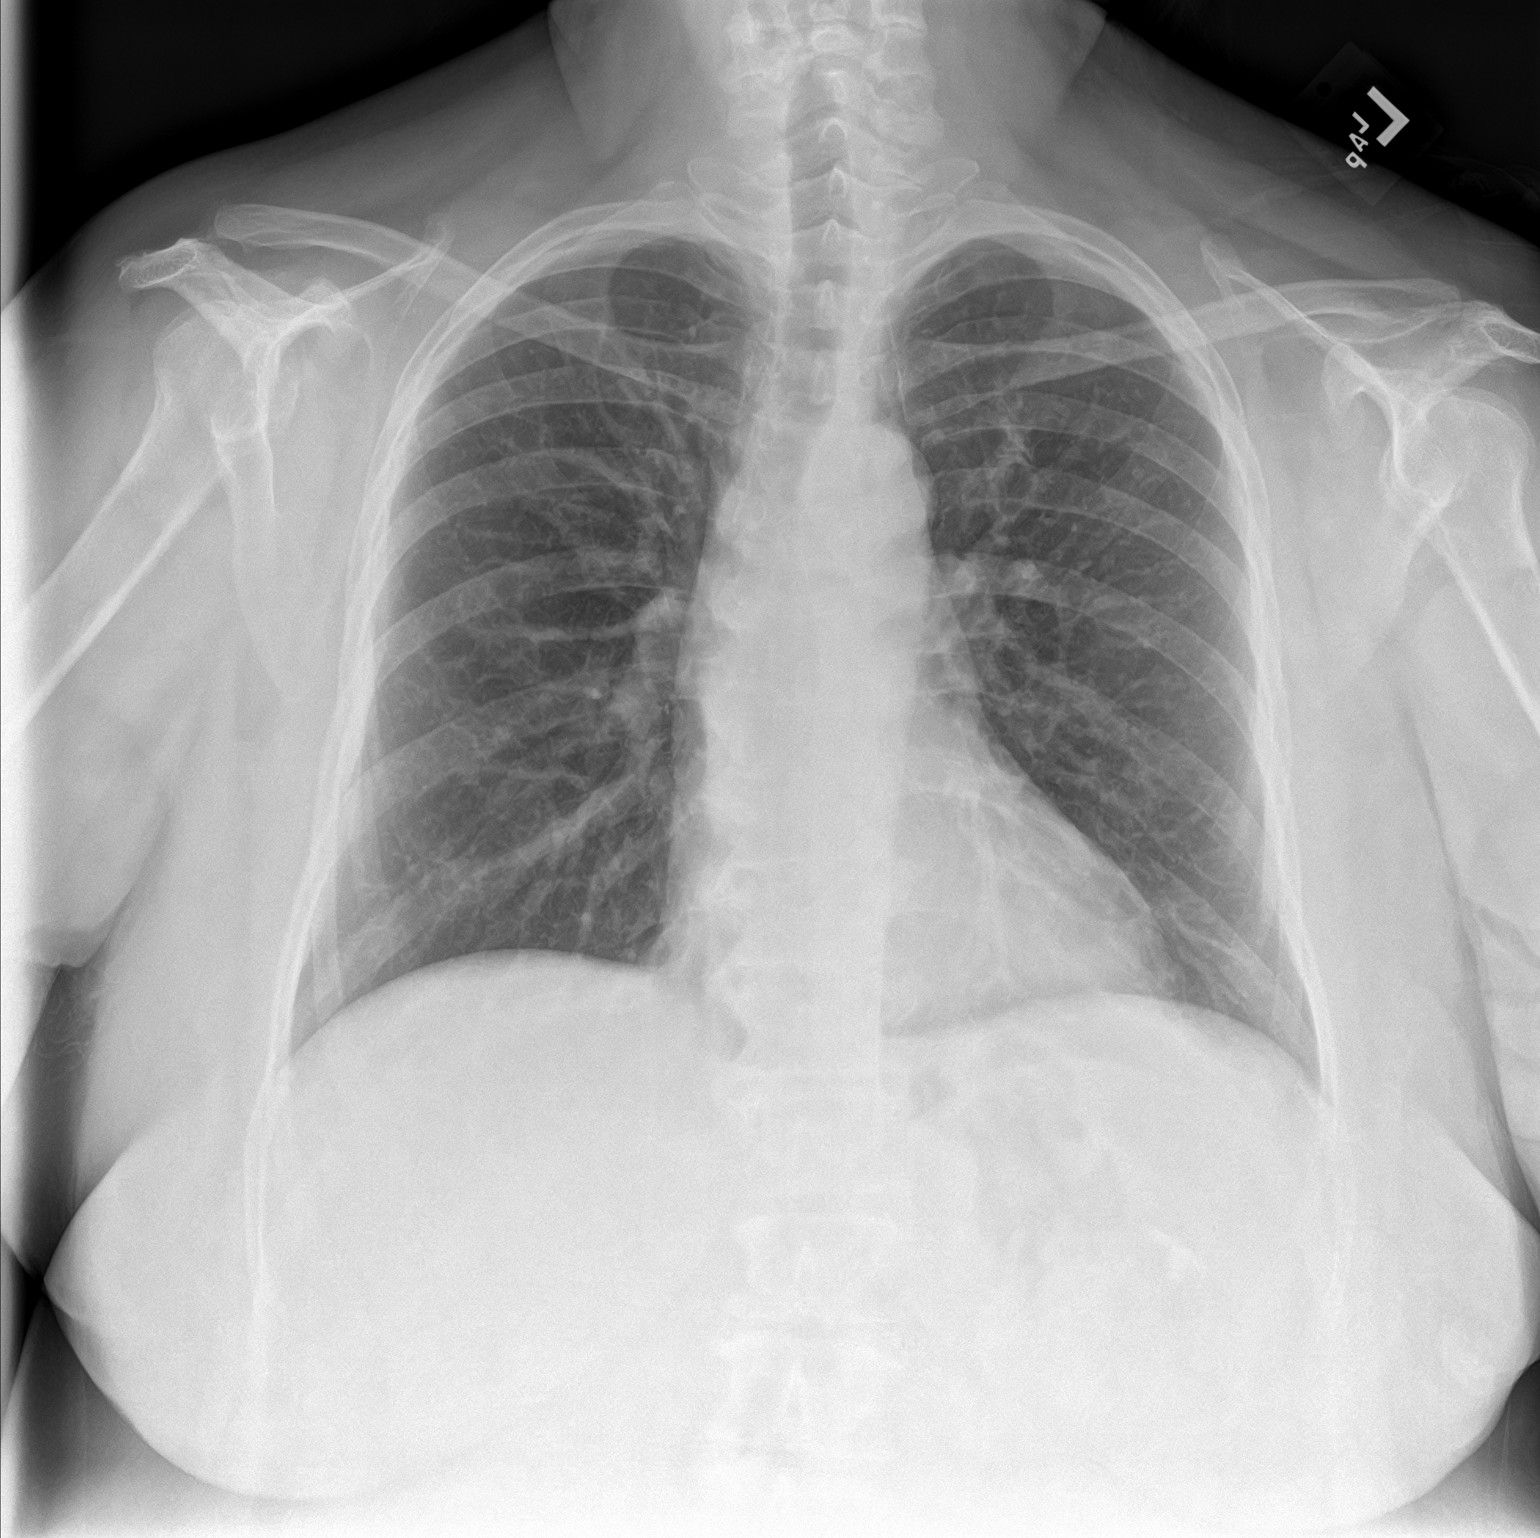

[chest lat]
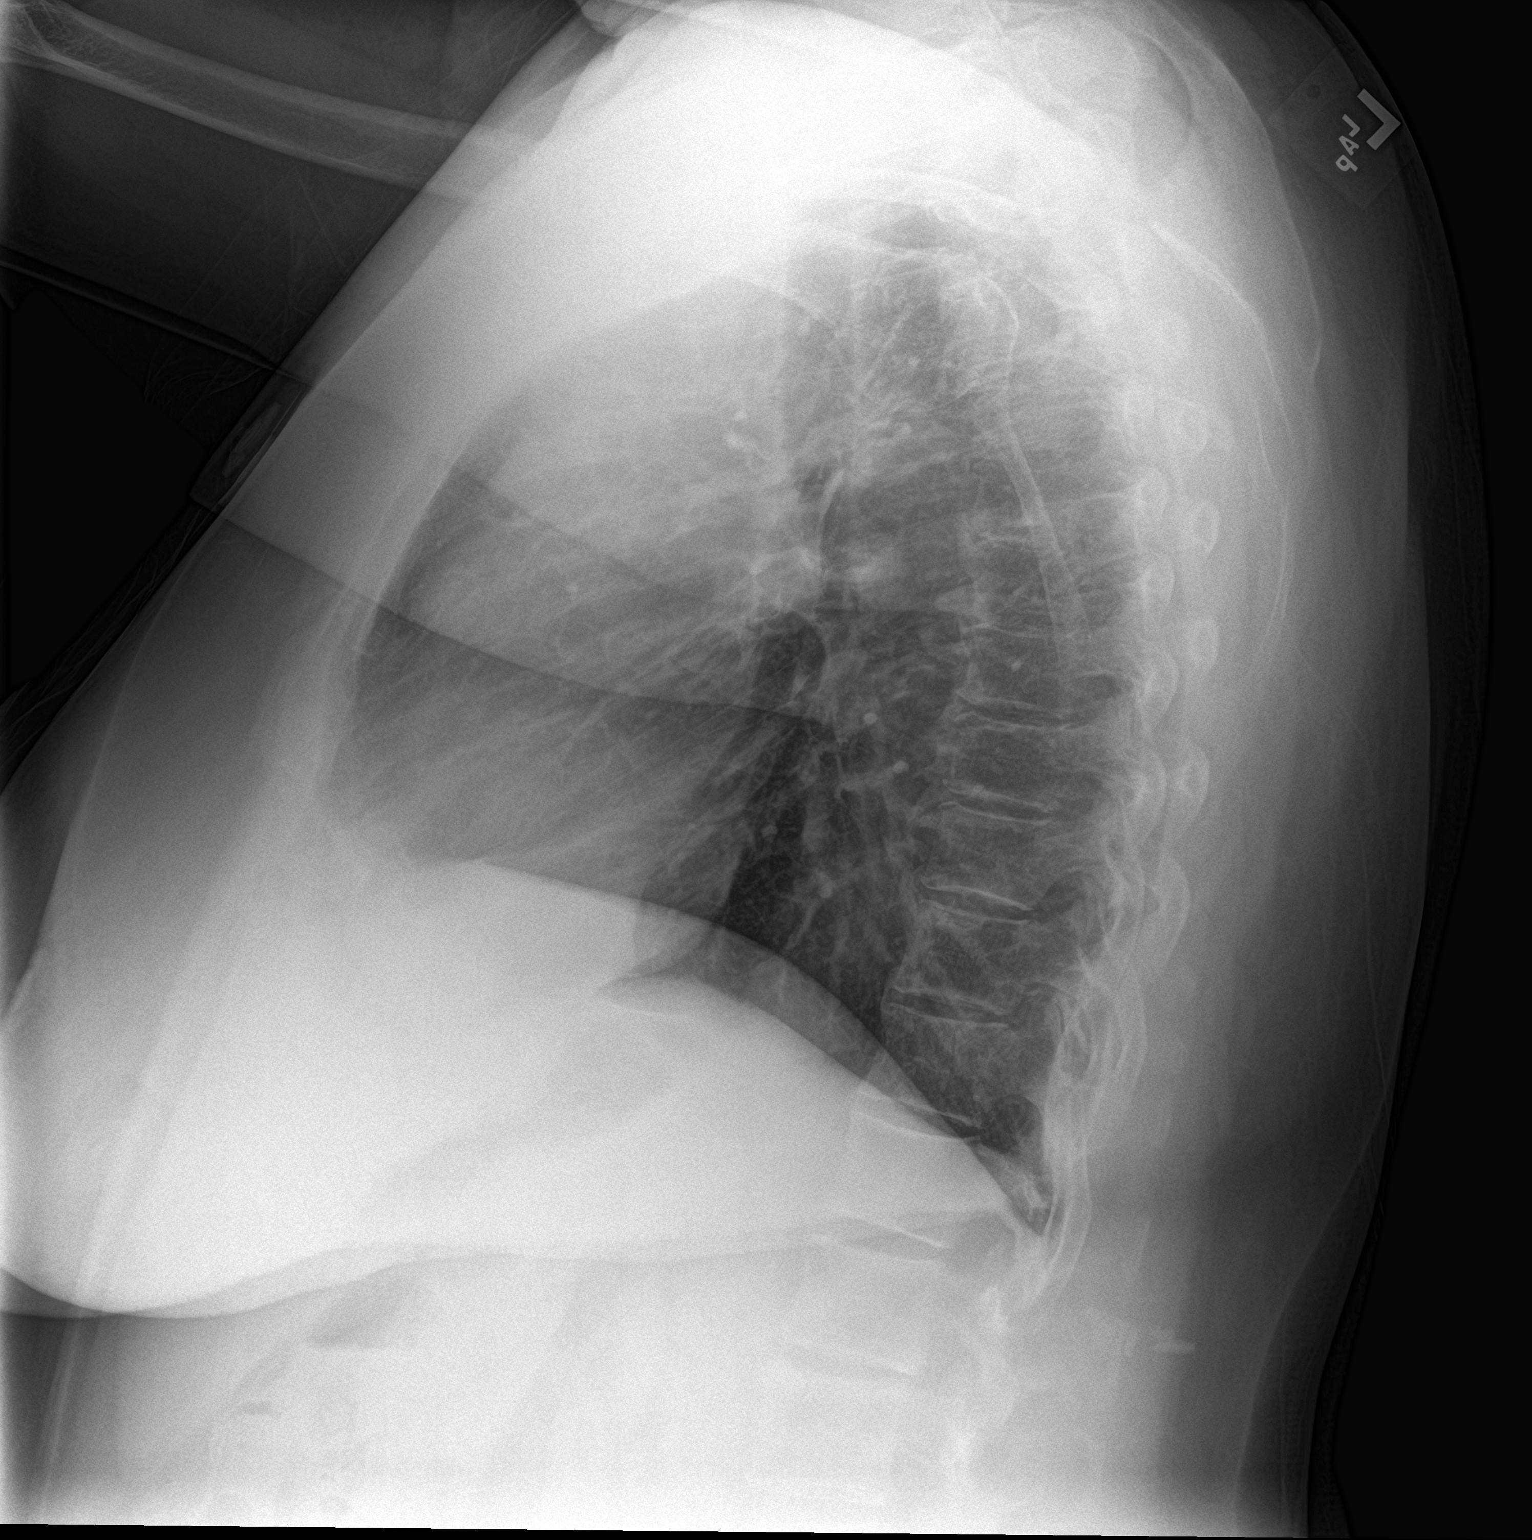

[2 of 2 positions shown; findings below may reference images not displayed]

FINDINGS: Lungs are adequately inflated without consolidation or effusion.
Cardiomediastinal silhouette is within normal. There mild degenerate
changes of the spine.
IMPRESSION: No active cardiopulmonary disease.

## 2019-06-01 ENCOUNTER — Other Ambulatory Visit: Payer: Self-pay | Admitting: Family Medicine

## 2019-06-01 DIAGNOSIS — Z1231 Encounter for screening mammogram for malignant neoplasm of breast: Secondary | ICD-10-CM

## 2020-02-14 ENCOUNTER — Emergency Department
Admission: EM | Admit: 2020-02-14 | Discharge: 2020-02-15 | Disposition: A | Discharge status: Home / Self Care | Payer: BC Managed Care – PPO | Attending: Emergency Medicine | Admitting: Emergency Medicine

## 2020-02-14 ENCOUNTER — Other Ambulatory Visit: Payer: Self-pay

## 2020-02-14 ENCOUNTER — Encounter: Payer: Self-pay | Admitting: Emergency Medicine

## 2020-02-14 ENCOUNTER — Emergency Department: Payer: BC Managed Care – PPO

## 2020-02-14 DIAGNOSIS — Z7982 Long term (current) use of aspirin: Secondary | ICD-10-CM | POA: Diagnosis not present

## 2020-02-14 DIAGNOSIS — Z79899 Other long term (current) drug therapy: Secondary | ICD-10-CM | POA: Insufficient documentation

## 2020-02-14 DIAGNOSIS — I10 Essential (primary) hypertension: Secondary | ICD-10-CM | POA: Insufficient documentation

## 2020-02-14 DIAGNOSIS — R079 Chest pain, unspecified: Secondary | ICD-10-CM | POA: Insufficient documentation

## 2020-02-14 LAB — BASIC METABOLIC PANEL
Anion gap: 10 (ref 5–15)
BUN: 17 mg/dL (ref 8–23)
CO2: 25 mmol/L (ref 22–32)
Calcium: 9.9 mg/dL (ref 8.9–10.3)
Chloride: 103 mmol/L (ref 98–111)
Creatinine, Ser: 0.57 mg/dL (ref 0.44–1.00)
GFR, Estimated: >60 mL/min (ref >60)
Glucose, Bld: 97 mg/dL (ref 70–99)
Potassium: 3.7 mmol/L (ref 3.5–5.1)
Sodium: 138 mmol/L (ref 135–145)

## 2020-02-14 LAB — CBC
HCT: 42.4 % (ref 36.0–46.0)
Hemoglobin: 13.9 g/dL (ref 12.0–15.0)
MCH: 26.8 pg (ref 26.0–34.0)
MCHC: 32.8 g/dL (ref 30.0–36.0)
MCV: 81.9 fL (ref 80.0–100.0)
Platelets: 348 10*3/uL (ref 150–400)
RBC: 5.18 MIL/uL — ABNORMAL HIGH (ref 3.87–5.11)
RDW: 15.4 % (ref 11.5–15.5)
WBC: 12.3 10*3/uL — ABNORMAL HIGH (ref 4.0–10.5)
nRBC: 0 % (ref 0.0–0.2)

## 2020-02-14 LAB — TROPONIN I (HIGH SENSITIVITY)
Troponin I (High Sensitivity): <2 ng/L (ref <18)
Troponin I (High Sensitivity): 3 ng/L (ref <18)

## 2020-02-14 MED ORDER — FAMOTIDINE 20 MG PO TABS: 20.0000 mg | ORAL_TABLET | Freq: Every day | ORAL | 1 refills | Status: AC

## 2020-02-14 NOTE — Discharge Instructions (Signed)
Please seek medical attention for any high fevers, chest pain, shortness of breath, change in behavior, persistent vomiting, bloody stool or any other new or concerning symptoms.  

## 2020-02-14 NOTE — ED Provider Notes (Signed)
[facility] Emergency Department Provider Note    ____________________________________________   I have reviewed the triage vital signs and the nursing notes.   HISTORY  Chief Complaint Chest Pain   History limited by: Not Limited   HPI Amy Barker is a 60 y.o. female who presents to the emergency department today because of concern for chest pain. The patient states that she was at work when the pain started. Located in her central chest it lasted roughly 3-4 minutes. She did feel like she had some diaphoresis with it. The patient says that over the past couple of days she has had intermittent episodes of chest pain. She says that she has also had some discomfort in her upper back. Denies similar pain in the past. Denies any heavy or unusual exertion recently. Denies any recent fevers.    Records reviewed. Per medical record review patient has a history of GERD, HTN.  Past Medical History:  Diagnosis Date  . Anxiety   . GERD (gastroesophageal reflux disease)   . Hypertension   . Morbid obesity ([place])   . Umbilical hernia     There are no problems to display for this patient.   Past Surgical History:  Procedure Laterality Date  . COLONOSCOPY WITH PROPOFOL N/A 05/08/2017   Procedure: COLONOSCOPY WITH PROPOFOL;  Surgeon: Manya Silvas, MD;  Location: [facility] ENDOSCOPY;  Service: Endoscopy;  Laterality: N/A;  . TUBAL LIGATION      Prior to Admission medications   Medication Sig Start Date End Date Taking? Authorizing Provider  albuterol (PROVENTIL HFA;VENTOLIN HFA) 108 (90 Base) MCG/ACT inhaler Inhale 2 puffs every 6 (six) hours as needed into the lungs for wheezing or shortness of breath. 12/04/16   Schuyler Amor, MD  aspirin EC 81 MG tablet Take 81 mg by mouth daily.    [provider]  losartan (COZAAR) 50 MG tablet Take 50 mg by mouth daily.    [provider]    Allergies Patient has no known allergies.  Family  History  Problem Relation Age of Onset  . Breast cancer Sister   . Breast cancer Cousin     Social History Social History   Tobacco Use  . Smoking status: Never Smoker  . Smokeless tobacco: Never Used  Substance Use Topics  . Alcohol use: Yes    Comment: occ.  . Drug use: No    Review of Systems Constitutional: No fever/chills Eyes: No visual changes. ENT: No sore throat. Cardiovascular: Positive for chest pain. Respiratory: Denies shortness of breath. Gastrointestinal: No abdominal pain.  No nausea, no vomiting.  No diarrhea.   Genitourinary: Negative for dysuria. Musculoskeletal: Positive for upper back pain. Skin: Negative for rash. Neurological: Negative for headaches, focal weakness or numbness.  ____________________________________________   PHYSICAL EXAM:  VITAL SIGNS: ED Triage Vitals  Enc Vitals Group     BP 02/14/20 1803 (!) 144/81     Pulse Rate 02/14/20 1803 78     Resp 02/14/20 1803 16     Temp 02/14/20 1803 98.2 F (36.8 C)     Temp Source 02/14/20 1803 Oral     SpO2 02/14/20 1803 100 %     Weight 02/14/20 1805 249 lb (112.9 kg)     Height 02/14/20 1805 5\' 1"  (1.549 m)     Head Circumference --      Peak Flow --      Pain Score 02/14/20 1805 0   Constitutional: Alert and oriented.  Eyes:  Conjunctivae are normal.  ENT      Head: Normocephalic and atraumatic.      Nose: No congestion/rhinnorhea.      Mouth/Throat: Mucous membranes are moist.      Neck: No stridor. Hematological/Lymphatic/Immunilogical: No cervical lymphadenopathy. Cardiovascular: Normal rate, regular rhythm.  No murmurs, rubs, or gallops.  Respiratory: Normal respiratory effort without tachypnea nor retractions. Breath sounds are clear and equal bilaterally. No wheezes/rales/rhonchi. Gastrointestinal: Soft and non tender. No rebound. No guarding.  Genitourinary: Deferred Musculoskeletal: Normal range of motion in all extremities. No lower extremity edema. Neurologic:  Normal  speech and language. No gross focal neurologic deficits are appreciated.  Skin:  Skin is warm, dry and intact. No rash noted. Psychiatric: Mood and affect are normal. Speech and behavior are normal. Patient exhibits appropriate insight and judgment.  ____________________________________________    LABS (pertinent positives/negatives)  BMP wnl  CBC wbc 12.3, hgb 13.9, plt 348 Trop hs 3 from <2  ____________________________________________   EKG  I, Nance Pear, attending physician, personally viewed and interpreted this EKG  EKG Time: 1805 Rate: 69 Rhythm: accelerated junctional rhythm Axis: left axis deviation Intervals: qtc 435 QRS: narrow, q waves v1 ST changes: no st elevation Impression: abnormal ekg  ____________________________________________    RADIOLOGY  CXR No active cardiopulmonary disease  ____________________________________________   PROCEDURES  Procedures  ____________________________________________   INITIAL IMPRESSION / ASSESSMENT AND PLAN / ED COURSE  Pertinent labs & imaging results that were available during my care of the patient were reviewed by me and considered in my medical decision making (see chart for details).   Patient presented to the emergency department today because of concern for chest pain. At the time of my exam the patient states that her pain has improved. The patient had negative trop x 2. CXR without concerning findings. ECG without any st elevation. At this time I do have low concern for ACS. Also low concern for PE, dissection. No pna or ptx on CXR. Do wonder if patient is suffering from acid reflux. Discussed this with the patient and will start patient on antacid. Did discuss importance of follow up with PCP, will also give patient cardiology information.  ____________________________________________   FINAL CLINICAL IMPRESSION(S) / ED DIAGNOSES  Final diagnoses:  Nonspecific chest pain     Note: This  dictation was prepared with Dragon dictation. Any transcriptional errors that result from this process are unintentional     Nance Pear, MD 02/14/20 [phone]

## 2020-02-14 NOTE — ED Triage Notes (Signed)
First Nurse Note:  Arrives via [facility] with C/O chest pain.  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  324 mg ASA given and 20 g RAC by EMS.  EKG wnl.  Denies current c/o CP.  VS wnl.  CBG:  123

## 2020-02-14 NOTE — ED Triage Notes (Signed)
Patient presents to the ED with intermittent centralized chest pain for the past several days and occasionally pain in her shoulder blades and radiating into her neck.  Patient is alert and oriented x 4.  No obvious distress at this time.  Patient states currently she is having some nagging discomfort under her left breast.

## 2020-02-24 ENCOUNTER — Encounter: Payer: Self-pay | Admitting: Cardiology

## 2020-02-24 ENCOUNTER — Other Ambulatory Visit: Payer: Self-pay

## 2020-02-24 ENCOUNTER — Ambulatory Visit: Payer: BC Managed Care – PPO | Admitting: Cardiology

## 2020-02-24 VITALS — BP 150/100 | HR 66 | Ht 62.0 in | Wt 249.0 lb

## 2020-02-24 DIAGNOSIS — I10 Essential (primary) hypertension: Secondary | ICD-10-CM

## 2020-02-24 DIAGNOSIS — R079 Chest pain, unspecified: Secondary | ICD-10-CM

## 2020-02-24 DIAGNOSIS — E7800 Pure hypercholesterolemia, unspecified: Secondary | ICD-10-CM | POA: Diagnosis not present

## 2020-02-24 DIAGNOSIS — R072 Precordial pain: Secondary | ICD-10-CM | POA: Diagnosis not present

## 2020-02-24 MED ORDER — LOSARTAN POTASSIUM 50 MG PO TABS: 50.0000 mg | ORAL_TABLET | Freq: Two times a day (BID) | ORAL | 5 refills | Status: DC

## 2020-02-24 MED ORDER — ATORVASTATIN CALCIUM 20 MG PO TABS: 20.0000 mg | ORAL_TABLET | Freq: Every day | ORAL | 5 refills | Status: DC

## 2020-02-24 NOTE — Progress Notes (Signed)
Cardiology Office Note:    Date:  02/24/2020   ID:  Lorenza, Shakir [birth year], MRN [id number]  PCP:  Valera Castle, MD  [facility] Cardiologist:  Kate Sable, MD  [place] Electrophysiologist:  None   Referring MD: Nance Pear, MD   Chief Complaint  Patient presents with  . NEW patient-Eval of sharp chest pain   Amy Barker is a 60 y.o. female who is being seen today for the evaluation of chest pain at the request of Nance Pear, MD.   History of Present Illness:    Amy Barker is a 60 y.o. female with a hx of hypertension, obesity, hyperlipidemia, anxiety who presents due to chest pain.  Was seen in the ED on 02/14/2020 because of central chest pain. She was at home when symptoms began. Describes central chest pressure associated with some shoulder pain. Work-up with EKG and troponins were unrevealing. She has not had any symptoms since. Denies any history of heart disease. Takes her medications as prescribed, blood pressure at home usually in the high 140s. Was previously on a cholesterol pill but was no longer refill for some reason.  Past Medical History:  Diagnosis Date  . Anxiety   . GERD (gastroesophageal reflux disease)   . Hypertension   . Morbid obesity ([place])   . Umbilical hernia     Past Surgical History:  Procedure Laterality Date  . CARDIAC CATHETERIZATION    . COLONOSCOPY WITH PROPOFOL N/A 05/08/2017   Procedure: COLONOSCOPY WITH PROPOFOL;  Surgeon: Manya Silvas, MD;  Location: [facility] ENDOSCOPY;  Service: Endoscopy;  Laterality: N/A;  . TUBAL LIGATION      Current Medications: Current Meds  Medication Sig  . albuterol (PROVENTIL HFA;VENTOLIN HFA) 108 (90 Base) MCG/ACT inhaler Inhale 2 puffs every 6 (six) hours as needed into the lungs for wheezing or shortness of breath.  Marland Kitchen aspirin EC 81 MG tablet Take 81 mg by mouth daily.  Marland Kitchen atorvastatin (LIPITOR) 20 MG tablet Take 1 tablet (20 mg total) by mouth  daily.  . famotidine (PEPCID) 20 MG tablet Take 1 tablet (20 mg total) by mouth daily.  . montelukast (SINGULAIR) 10 MG tablet Take 10 mg by mouth daily as needed.  . [DISCONTINUED] losartan (COZAAR) 50 MG tablet Take 50 mg by mouth daily.     Allergies:   Patient has no known allergies.   Social History   Socioeconomic History  . Marital status: Single    Spouse name: Not on file  . Number of children: Not on file  . Years of education: Not on file  . Highest education level: Not on file  Occupational History  . Not on file  Tobacco Use  . Smoking status: Never Smoker  . Smokeless tobacco: Never Used  Vaping Use  . Vaping Use: Never used  Substance and Sexual Activity  . Alcohol use: Yes    Comment: occ.  . Drug use: No  . Sexual activity: Not on file  Other Topics Concern  . Not on file  Social History Narrative  . Not on file   Social Determinants of Health   Financial Resource Strain: Not on file  Food Insecurity: Not on file  Transportation Needs: Not on file  Physical Activity: Not on file  Stress: Not on file  Social Connections: Not on file     Family History: The patient's family history includes Breast cancer in her cousin and sister; Colon cancer in her sister; Diabetes in  her brother; Heart disease in her brother; Hypertension in her mother.  ROS:   Please see the history of present illness.     All other systems reviewed and are negative.  EKGs/Labs/Other Studies Reviewed:    The following studies were reviewed today:   EKG:  EKG is  ordered today.  The ekg ordered today demonstrates normal sinus rhythm  Recent Labs: 02/14/2020: BUN 17; Creatinine, Ser 0.57; Hemoglobin 13.9; Platelets 348; Potassium 3.7; Sodium 138  Recent Lipid Panel No results found for: CHOL, TRIG, HDL, CHOLHDL, VLDL, LDLCALC, LDLDIRECT   Risk Assessment/Calculations:      Physical Exam:    VS:  BP (!) 150/100 (BP Location: Right Arm, Patient Position: Sitting, Cuff  Size: Large)   Pulse 66   Ht 5\' 2"  (1.575 m)   Wt 249 lb (112.9 kg)   SpO2 98%   BMI 45.54 kg/m     Wt Readings from Last 3 Encounters:  02/24/20 249 lb (112.9 kg)  02/14/20 249 lb (112.9 kg)  05/08/17 240 lb (108.9 kg)     GEN:  Well nourished, well developed in no acute distress HEENT: Normal NECK: No JVD; No carotid bruits LYMPHATICS: No lymphadenopathy CARDIAC: RRR, no murmurs, rubs, gallops RESPIRATORY:  Clear to auscultation without rales, wheezing or rhonchi  ABDOMEN: Soft, non-tender, distended MUSCULOSKELETAL:  No edema; No deformity  SKIN: Warm and dry NEUROLOGIC:  Alert and oriented x 3 PSYCHIATRIC:  Normal affect   ASSESSMENT:    1. Precordial pain   2. Primary hypertension   3. Pure hypercholesterolemia   4. Morbid obesity ([place])   5. Chest pain, unspecified type    PLAN:    In order of problems listed above:  1. Chest pain, risk factors hypertension, hyperlipidemia, obesity. Obtain echocardiogram to evaluate systolic and diastolic function, Lexiscan Myoview to evaluate presence of ischemia. 2. Hypertension, BP elevated. Increase losartan to 50 mg twice daily. 3. Hyperlipidemia, 10-year ASCVD risk 7.7%. Start Lipitor 20 mg daily. 4. Morbid obesity, low-calorie diet, exercise, weight loss advised.  Follow-up after echo and Lexiscan Myoview.   Shared Decision Making/Informed Consent The risks [chest pain, shortness of breath, cardiac arrhythmias, dizziness, blood pressure fluctuations, myocardial infarction, stroke/transient ischemic attack, nausea, vomiting, allergic reaction, radiation exposure, metallic taste sensation and life-threatening complications (estimated to be 1 in 10,000)], benefits (risk stratification, diagnosing coronary artery disease, treatment guidance) and alternatives of a nuclear stress test were discussed in detail with Amy Barker and she agrees to proceed.   Medication Adjustments/Labs and Tests Ordered: Current medicines are  reviewed at length with the patient today.  Concerns regarding medicines are outlined above.  Orders Placed This Encounter  Procedures  . NM Myocar Multi W/Spect W/Wall Motion / EF  . EKG 12-Lead  . ECHOCARDIOGRAM COMPLETE   Meds ordered this encounter  Medications  . losartan (COZAAR) 50 MG tablet    Sig: Take 1 tablet (50 mg total) by mouth 2 (two) times daily.    Dispense:  60 tablet    Refill:  5  . atorvastatin (LIPITOR) 20 MG tablet    Sig: Take 1 tablet (20 mg total) by mouth daily.    Dispense:  30 tablet    Refill:  5    Patient Instructions  Medication Instructions:   1.  INCREASE your Losartan to 50 MG: Take 1 tab by mouth twice a day. 2.  START taking Lipitor 20 MG: Take 1 tab by mouth once a day.  *If you need a refill  on your cardiac medications before your next appointment, please call your pharmacy*   Lab Work: None Ordered  If you have labs (blood work) drawn today and your tests are completely normal, you will receive your results only by: Marland Kitchen MyChart Message (if you have MyChart) OR . A paper copy in the mail If you have any lab test that is abnormal or we need to change your treatment, we will call you to review the results.   Testing/Procedures:  1.  Your physician has requested that you have an echocardiogram. Echocardiography is a painless test that uses sound waves to create images of your heart. It provides your doctor with information about the size and shape of your heart and how well your heart's chambers and valves are working. This procedure takes approximately one hour. There are no restrictions for this procedure.   2.  [place]   Your caregiver has ordered a Stress Test with nuclear imaging. The purpose of this test is to evaluate the blood supply to your heart muscle. This procedure is referred to as a "Non-Invasive Stress Test." This is because other than having an IV started in your vein, nothing is inserted or "invades" your body.  Cardiac stress tests are done to find areas of poor blood flow to the heart by determining the extent of coronary artery disease (CAD). Some patients exercise on a treadmill, which naturally increases the blood flow to your heart, while others who are  unable to walk on a treadmill due to physical limitations have a pharmacologic/chemical stress agent called Lexiscan . This medicine will mimic walking on a treadmill by temporarily increasing your coronary blood flow.      PLEASE REPORT TO [facility] MEDICAL MALL ENTRANCE   THE VOLUNTEERS AT THE FIRST DESK WILL DIRECT YOU WHERE TO GO      Date of Procedure:_____________________________________   Arrival Time for Procedure:______________________________    PLEASE NOTIFY THE OFFICE AT LEAST 24 HOURS IN ADVANCE IF YOU ARE UNABLE TO KEEP YOUR APPOINTMENT.  [place] 24 HOURS IN ADVANCE IF YOU ARE UNABLE TO KEEP YOUR APPOINTMENT. [phone]    How to prepare for your Myoview test:     1. Do not eat or drink after midnight  2. No caffeine for 24 hours prior to test  3. No smoking 24 hours prior to test.  4. Unless instructed otherwise, Take your medication with a small sips of water.    5.         Ladies, please do not wear dresses. Skirts or pants are appropriate. Please wear a short sleeve shirt.  6. No perfume, cologne or lotion.  7. Wear comfortable walking shoes. No heels!      Follow-Up: At [facility], you and your health needs are our priority.  As part of our continuing mission to provide you with exceptional heart care, we have created designated Provider Care Teams.  These Care Teams include your primary Cardiologist (physician) and Advanced Practice Providers (APPs -  Physician Assistants and Nurse Practitioners) who all work together to provide you with the care you need, when you need it.  We recommend signing up for the patient portal called "MyChart".  Sign up information  is provided on this After Visit Summary.  MyChart is used to connect with patients for Virtual Visits (Telemedicine).  Patients are able to view lab/test results, encounter notes, upcoming appointments, etc.  Non-urgent messages can be sent to  your provider as well.   To learn more about what you can do with MyChart, go to [email or web address].    Your next appointment:   Follow up after Myoview and Echo   The format for your next appointment:   In Person  Provider:   Kate Sable, MD   Other Instructions      Signed, Kate Sable, MD  02/24/2020 12:40 PM    [place]

## 2020-02-24 NOTE — Patient Instructions (Signed)
Medication Instructions:   1.  INCREASE your Losartan to 50 MG: Take 1 tab by mouth twice a day. 2.  START taking Lipitor 20 MG: Take 1 tab by mouth once a day.  *If you need a refill on your cardiac medications before your next appointment, please call your pharmacy*   Lab Work: None Ordered  If you have labs (blood work) drawn today and your tests are completely normal, you will receive your results only by: Marland Kitchen MyChart Message (if you have MyChart) OR . A paper copy in the mail If you have any lab test that is abnormal or we need to change your treatment, we will call you to review the results.   Testing/Procedures:  1.  Your physician has requested that you have an echocardiogram. Echocardiography is a painless test that uses sound waves to create images of your heart. It provides your doctor with information about the size and shape of your heart and how well your heart's chambers and valves are working. This procedure takes approximately one hour. There are no restrictions for this procedure.   2.  [place]   Your caregiver has ordered a Stress Test with nuclear imaging. The purpose of this test is to evaluate the blood supply to your heart muscle. This procedure is referred to as a "Non-Invasive Stress Test." This is because other than having an IV started in your vein, nothing is inserted or "invades" your body. Cardiac stress tests are done to find areas of poor blood flow to the heart by determining the extent of coronary artery disease (CAD). Some patients exercise on a treadmill, which naturally increases the blood flow to your heart, while others who are  unable to walk on a treadmill due to physical limitations have a pharmacologic/chemical stress agent called Lexiscan . This medicine will mimic walking on a treadmill by temporarily increasing your coronary blood flow.      PLEASE REPORT TO [facility] MEDICAL MALL ENTRANCE   THE VOLUNTEERS AT THE FIRST DESK WILL DIRECT YOU WHERE  TO GO      Date of Procedure:_____________________________________   Arrival Time for Procedure:______________________________    PLEASE NOTIFY THE OFFICE AT LEAST 24 HOURS IN ADVANCE IF YOU ARE UNABLE TO KEEP YOUR APPOINTMENT.  [place] 24 HOURS IN ADVANCE IF YOU ARE UNABLE TO KEEP YOUR APPOINTMENT. [phone]    How to prepare for your Myoview test:     1. Do not eat or drink after midnight  2. No caffeine for 24 hours prior to test  3. No smoking 24 hours prior to test.  4. Unless instructed otherwise, Take your medication with a small sips of water.    5.         Ladies, please do not wear dresses. Skirts or pants are appropriate. Please wear a short sleeve shirt.  6. No perfume, cologne or lotion.  7. Wear comfortable walking shoes. No heels!      Follow-Up: At [facility], you and your health needs are our priority.  As part of our continuing mission to provide you with exceptional heart care, we have created designated Provider Care Teams.  These Care Teams include your primary Cardiologist (physician) and Advanced Practice Providers (APPs -  Physician Assistants and Nurse Practitioners) who all work together to provide you with the care you need, when you need it.  We recommend signing up for the patient portal called "MyChart".  Sign up  information is provided on this After Visit Summary.  MyChart is used to connect with patients for Virtual Visits (Telemedicine).  Patients are able to view lab/test results, encounter notes, upcoming appointments, etc.  Non-urgent messages can be sent to your provider as well.   To learn more about what you can do with MyChart, go to [email or web address].    Your next appointment:   Follow up after Myoview and Echo   The format for your next appointment:   In Person  Provider:   Kate Sable, MD   Other Instructions

## 2020-03-09 ENCOUNTER — Ambulatory Visit: Payer: BC Managed Care – PPO

## 2020-03-13 ENCOUNTER — Other Ambulatory Visit: Payer: BC Managed Care – PPO

## 2020-03-16 ENCOUNTER — Ambulatory Visit: Payer: BC Managed Care – PPO | Admitting: Cardiology

## 2020-03-20 ENCOUNTER — Other Ambulatory Visit: Payer: Self-pay

## 2020-03-20 MED ORDER — LOSARTAN POTASSIUM 50 MG PO TABS: 50.0000 mg | ORAL_TABLET | Freq: Two times a day (BID) | ORAL | 0 refills | Status: AC

## 2020-03-26 ENCOUNTER — Ambulatory Visit: Payer: BC Managed Care – PPO

## 2020-03-30 ENCOUNTER — Other Ambulatory Visit: Payer: BC Managed Care – PPO

## 2020-04-02 ENCOUNTER — Ambulatory Visit: Payer: BC Managed Care – PPO | Admitting: Cardiology

## 2020-05-07 ENCOUNTER — Other Ambulatory Visit: Payer: Self-pay | Admitting: *Deleted

## 2020-05-09 ENCOUNTER — Other Ambulatory Visit: Payer: Self-pay

## 2020-05-09 ENCOUNTER — Telehealth: Payer: Self-pay | Admitting: Cardiology

## 2020-05-09 MED ORDER — ATORVASTATIN CALCIUM 20 MG PO TABS: 20.0000 mg | ORAL_TABLET | Freq: Every day | ORAL | 0 refills | Status: AC

## 2020-05-09 NOTE — Telephone Encounter (Signed)
Attempted to schedule no ans no vm  

## 2020-05-09 NOTE — Telephone Encounter (Signed)
-----   Message from Horton Finer sent at 05/09/2020 11:25 AM EDT ----- Regarding: appointment Please schedule overdue F/U appointment, echo, and myoview. Patient previously cancelled because she went [place] of [place] but needs F/U appointment for medication refills. Thank you!

## 2020-05-30 NOTE — Telephone Encounter (Signed)
Attempted to schedule no ans no vm  

## 2020-07-23 ENCOUNTER — Ambulatory Visit: Admission: RE | Admit: 2020-07-23 | Payer: BC Managed Care – PPO | Source: Ambulatory Visit

## 2020-08-22 ENCOUNTER — Other Ambulatory Visit: Payer: BC Managed Care – PPO

## 2021-04-29 NOTE — Telephone Encounter (Signed)
Out of state will call book.  3rd attempt closing encounter.  ?

## 2021-08-16 ENCOUNTER — Other Ambulatory Visit: Payer: Self-pay

## 2021-08-16 ENCOUNTER — Emergency Department: Payer: Self-pay

## 2021-08-16 DIAGNOSIS — I10 Essential (primary) hypertension: Secondary | ICD-10-CM | POA: Insufficient documentation

## 2021-08-16 DIAGNOSIS — F419 Anxiety disorder, unspecified: Secondary | ICD-10-CM | POA: Insufficient documentation

## 2021-08-16 LAB — TROPONIN I (HIGH SENSITIVITY)
Troponin I (High Sensitivity): 3 ng/L (ref <18)
Troponin I (High Sensitivity): 3 ng/L (ref <18)

## 2021-08-16 LAB — CBC
HCT: 44.9 % (ref 36.0–46.0)
Hemoglobin: 14.4 g/dL (ref 12.0–15.0)
MCH: 27.7 pg (ref 26.0–34.0)
MCHC: 32.1 g/dL (ref 30.0–36.0)
MCV: 86.3 fL (ref 80.0–100.0)
Platelets: 350 10*3/uL (ref 150–400)
RBC: 5.2 MIL/uL — ABNORMAL HIGH (ref 3.87–5.11)
RDW: 13.7 % (ref 11.5–15.5)
WBC: 9.9 10*3/uL (ref 4.0–10.5)
nRBC: 0 % (ref 0.0–0.2)

## 2021-08-16 LAB — COMPREHENSIVE METABOLIC PANEL
ALT: 20 U/L (ref 0–44)
AST: 18 U/L (ref 15–41)
Albumin: 4.2 g/dL (ref 3.5–5.0)
Alkaline Phosphatase: 68 U/L (ref 38–126)
Anion gap: 7 (ref 5–15)
BUN: 12 mg/dL (ref 8–23)
CO2: 23 mmol/L (ref 22–32)
Calcium: 9.7 mg/dL (ref 8.9–10.3)
Chloride: 107 mmol/L (ref 98–111)
Creatinine, Ser: 0.5 mg/dL (ref 0.44–1.00)
GFR, Estimated: >60 mL/min (ref >60)
Glucose, Bld: 102 mg/dL — ABNORMAL HIGH (ref 70–99)
Potassium: 3.5 mmol/L (ref 3.5–5.1)
Sodium: 137 mmol/L (ref 135–145)
Total Bilirubin: 1.1 mg/dL (ref 0.3–1.2)
Total Protein: 8.2 g/dL — ABNORMAL HIGH (ref 6.5–8.1)

## 2021-08-16 NOTE — ED Notes (Signed)
Secure chat message sent to dr. Jacqualine Code to inquire if head ct needed. Awaiting response.

## 2021-08-16 NOTE — ED Triage Notes (Signed)
Pt states woke up this morning dizzy and is experiencing high blood pressure. Pt states she is also having neck pain. Pt states blood pressure at home was over 097 systolic.

## 2021-08-17 ENCOUNTER — Emergency Department
Admission: EM | Admit: 2021-08-17 | Discharge: 2021-08-17 | Disposition: A | Discharge status: Home / Self Care | Payer: Self-pay | Attending: Emergency Medicine | Admitting: Emergency Medicine

## 2021-08-17 DIAGNOSIS — I10 Essential (primary) hypertension: Secondary | ICD-10-CM

## 2021-08-17 DIAGNOSIS — R42 Dizziness and giddiness: Secondary | ICD-10-CM

## 2021-08-17 DIAGNOSIS — F419 Anxiety disorder, unspecified: Secondary | ICD-10-CM

## 2021-08-17 NOTE — Discharge Instructions (Addendum)

## 2021-08-17 NOTE — ED Provider Notes (Signed)
[facility] Provider Note    Event Date/Time   First MD Initiated Contact with Patient 08/17/21 0045     (approximate)   History   Hypertension   HPI  Amy Barker is a 61 y.o. female with history of hypertension, obesity, and anxiety.  She presents tonight with concerns about hypertension.  She has also been having some dizziness.  She first noticed her symptoms when she woke up this morning.  She felt a little bit lightheaded.  She was not numb or weak in any of her extremities and has had no confusion or word finding difficulties.  She just felt a little bit "off".  Over the course of the day she checked her blood pressure a few times and it was extremely variable, occasionally greater than 093 systolic and occasionally down to about 818 systolic.  She thinks that she started to become anxious and worried about her symptoms and her blood pressure which probably made it worse.  They decided to come in and get checked out to make everything was okay.  She feels a little bit better now, still a little bit dizzy when she stands up but otherwise normal.  She denies having chest pain, shortness of breath, nausea, vomiting, and abdominal pain.  No difficulty with ambulation.  No weakness in her arms nor her legs.     Physical Exam   Triage Vital Signs: ED Triage Vitals  Enc Vitals Group     BP 08/16/21 1945 (!) 184/118     Pulse Rate 08/16/21 1944 77     Resp 08/16/21 1944 18     Temp 08/16/21 1944 98 F (36.7 C)     Temp Source 08/16/21 1944 Oral     SpO2 08/16/21 1944 98 %     Weight 08/16/21 1945 113.4 kg (250 lb)     Height 08/16/21 1945 1.575 m ('5\' 2"'$ )     Head Circumference --      Peak Flow --      Pain Score 08/16/21 1945 8     Pain Loc --      Pain Edu? --      Excl. in [place]? --     Most recent vital signs: Vitals:   08/16/21 2233 08/17/21 0041  BP: (!) 181/94 (!) 161/90  Pulse: 73 72  Resp: 20 18  Temp:    SpO2: 99% 98%      General: Awake, no distress.  Mentating well. CV:  Good peripheral perfusion.  Normal heart sounds. Resp:  Normal effort.  Lungs are clear to auscultation bilaterally. Abd:  No distention.  No tenderness to palpation. Other:  Pupils are equal and reactive.  Extraocular motion is intact.  No nystagmus.  No dysmetria on finger-to-nose testing.  When the patient stands up too quickly she becomes a little bit dizzy, but she steadies quickly.  She is able to ambulate without any difficulty.   ED Results / Procedures / Treatments   Labs (all labs ordered are listed, but only abnormal results are displayed) Labs Reviewed  CBC - Abnormal; Notable for the following components:      Result Value   RBC 5.20 (*)    All other components within normal limits  COMPREHENSIVE METABOLIC PANEL - Abnormal; Notable for the following components:   Glucose, Bld 102 (*)    Total Protein 8.2 (*)    All other components within normal limits  TROPONIN I (HIGH SENSITIVITY)  TROPONIN I (HIGH  SENSITIVITY)     EKG  ED ECG REPORT I, Hinda Kehr, the attending physician, personally viewed and interpreted this ECG.  Date: 08/16/2021 EKG Time: 19:49 Rate: 74 Rhythm: normal sinus rhythm QRS Axis: LAD Intervals: normal ST/T Wave abnormalities: Non-specific ST segment / T-wave changes, but no clear evidence of acute ischemia. Narrative Interpretation: no definitive evidence of acute ischemia; does not meet STEMI criteria.    RADIOLOGY I viewed and interpreted the patient's head CT.  There are no acute abnormalities identified.  Radiology report agrees.    PROCEDURES:  Critical Care performed: No  Procedures   MEDICATIONS ORDERED IN ED: Medications - No data to display   IMPRESSION / MDM / [place] / ED COURSE  I reviewed the triage vital signs and the nursing notes.                              Differential diagnosis includes, but is not limited to, volume depletion,  peripheral vertigo, central vertigo (CVA), atypical ACS presentation, uncontrolled hypertension.  Patient's presentation is most consistent with acute presentation with potential threat to life or bodily function.  However, the patient's evaluation has been reassuring.  Her blood pressure has been quite variable, initially substantially elevated, but has come down without intervention to 161/90, and she usually takes her losartan in the evening and has not done so because she has been waiting in the emergency department.  Labs/studies ordered include EKG, head CT without contrast, high-sensitivity troponin x2, CBC, CMP.  EKG shows no evidence of ischemia.  As document above, head CT shows no acute abnormalities.  Lab results are all within normal limits.  Patient has a reassuring physical exam with no focal neurological deficits.  I believe she may be a little bit volume depleted which accounts for what appears to be more orthostasis than actual vertigo.  There is no evidence of a focal neurological deficit that would warrant MRI at this time.  I provided reassurance and encouraged her to take her medicines when she gets home, drink plenty of fluids over the weekend and avoid being out in the sun and exerting herself, and follow-up with your regular doctor this coming week.  I had my usual and customary hypertension discussion and she and her daughter understand that we will not be changing her medications at this time.  I gave my usual and customary follow-up recommendations and return precautions.       FINAL CLINICAL IMPRESSION(S) / ED DIAGNOSES   Final diagnoses:  Dizziness  Primary hypertension  Anxiety     Rx / DC Orders   ED Discharge Orders     None        Note:  This document was prepared using Dragon voice recognition software and may include unintentional dictation errors.   Hinda Kehr, MD 08/17/21 0330
